# Patient Record
Sex: Female | Born: 1950 | Race: White | Hispanic: No | Smoking: Never smoker
Health system: Southern US, Community
[De-identification: ages and names within clinical notes are randomized; demographics above are authoritative.]

## PROBLEM LIST (undated history)

## (undated) DIAGNOSIS — C4490 Unspecified malignant neoplasm of skin, unspecified: Secondary | ICD-10-CM

## (undated) DIAGNOSIS — R0981 Nasal congestion: Secondary | ICD-10-CM

## (undated) DIAGNOSIS — E119 Type 2 diabetes mellitus without complications: Secondary | ICD-10-CM

## (undated) DIAGNOSIS — I10 Essential (primary) hypertension: Secondary | ICD-10-CM

## (undated) HISTORY — PX: TUBAL LIGATION: SHX77

## (undated) HISTORY — DX: Nasal congestion: R09.81

## (undated) HISTORY — PX: TONSILLECTOMY: SUR1361

## (undated) HISTORY — DX: Unspecified malignant neoplasm of skin, unspecified: C44.90

## (undated) HISTORY — DX: Type 2 diabetes mellitus without complications: E11.9

## (undated) HISTORY — DX: Essential (primary) hypertension: I10

## (undated) HISTORY — PX: EYE SURGERY: SHX253

---

## 1987-02-05 HISTORY — PX: BREAST CYST ASPIRATION: SHX578

## 1997-02-04 HISTORY — PX: BREAST BIOPSY: SHX20

## 2003-05-02 ENCOUNTER — Other Ambulatory Visit: Payer: Self-pay

## 2004-11-12 ENCOUNTER — Ambulatory Visit: Payer: Self-pay | Admitting: General Surgery

## 2005-11-13 ENCOUNTER — Ambulatory Visit: Payer: Self-pay | Admitting: Unknown Physician Specialty

## 2007-01-28 ENCOUNTER — Ambulatory Visit: Payer: Self-pay | Admitting: Unknown Physician Specialty

## 2007-03-04 ENCOUNTER — Ambulatory Visit: Payer: Self-pay | Admitting: Unknown Physician Specialty

## 2008-07-01 ENCOUNTER — Ambulatory Visit: Payer: Self-pay | Admitting: Unknown Physician Specialty

## 2009-08-10 ENCOUNTER — Ambulatory Visit: Payer: Self-pay | Admitting: Unknown Physician Specialty

## 2010-09-26 ENCOUNTER — Ambulatory Visit: Payer: Self-pay | Admitting: Unknown Physician Specialty

## 2011-10-31 ENCOUNTER — Ambulatory Visit: Payer: Self-pay | Admitting: Physician Assistant

## 2012-11-03 ENCOUNTER — Ambulatory Visit: Payer: Self-pay | Admitting: Physician Assistant

## 2012-12-03 ENCOUNTER — Ambulatory Visit (INDEPENDENT_AMBULATORY_CARE_PROVIDER_SITE_OTHER): Payer: BC Managed Care – PPO | Admitting: Podiatrist

## 2012-12-03 ENCOUNTER — Encounter: Payer: Self-pay | Admitting: Podiatrist

## 2012-12-03 VITALS — BP 142/71 | HR 74 | Resp 16 | Ht 59.0 in | Wt 163.0 lb

## 2012-12-03 DIAGNOSIS — M779 Enthesopathy, unspecified: Secondary | ICD-10-CM

## 2012-12-03 DIAGNOSIS — M722 Plantar fascial fibromatosis: Secondary | ICD-10-CM

## 2012-12-03 NOTE — Patient Instructions (Signed)
Plantar Fasciitis (Heel Spur Syndrome)  with Rehab  The plantar fascia is a fibrous, ligament-like, soft-tissue structure that spans the bottom of the foot. Plantar fasciitis is a condition that causes pain in the foot due to inflammation of the tissue.  SYMPTOMS   · Pain and tenderness on the underneath side of the foot.  · Pain that worsens with standing or walking.  CAUSES   Plantar fasciitis is caused by irritation and injury to the plantar fascia on the underneath side of the foot. Common mechanisms of injury include:  · Direct trauma to bottom of the foot.  · Damage to a small nerve that runs under the foot where the main fascia attaches to the heel bone.  · Stress placed on the plantar fascia due to bone spurs.  RISK INCREASES WITH:   · Activities that place stress on the plantar fascia (running, jumping, pivoting, or cutting).  · Poor strength and flexibility.  · Improperly fitted shoes.  · Tight calf muscles.  · Flat feet.  · Failure to warm-up properly before activity.  · Obesity.  PREVENTION  · Warm up and stretch properly before activity.  · Allow for adequate recovery between workouts.  · Maintain physical fitness:  · Strength, flexibility, and endurance.  · Cardiovascular fitness.  · Maintain a health body weight.  · Avoid stress on the plantar fascia.  · Wear properly fitted shoes, including arch supports for individuals who have flat feet.  PROGNOSIS   If treated properly, then the symptoms of plantar fasciitis usually resolve without surgery. However, occasionally surgery is necessary.  RELATED COMPLICATIONS   · Recurrent symptoms that may result in a chronic condition.  · Problems of the lower back that are caused by compensating for the injury, such as limping.  · Pain or weakness of the foot during push-off following surgery.  · Chronic inflammation, scarring, and partial or complete fascia tear, occurring more often from repeated injections.  TREATMENT   Treatment initially involves the use of  ice and medication to help reduce pain and inflammation. The use of strengthening and stretching exercises may help reduce pain with activity, especially stretches of the Achilles tendon. These exercises may be performed at home or with a therapist. Your caregiver may recommend that you use heel cups of arch supports to help reduce stress on the plantar fascia. Occasionally, corticosteroid injections are given to reduce inflammation. If symptoms persist for greater than 6 months despite non-surgical (conservative), then surgery may be recommended.   MEDICATION   · If pain medication is necessary, then nonsteroidal anti-inflammatory medications, such as aspirin and ibuprofen, or other minor pain relievers, such as acetaminophen, are often recommended.  · Do not take pain medication within 7 days before surgery.  · Prescription pain relievers may be given if deemed necessary by your caregiver. Use only as directed and only as much as you need.  · Corticosteroid injections may be given by your caregiver. These injections should be reserved for the most serious cases, because they may only be given a certain number of times.  HEAT AND COLD  · Cold treatment (icing) relieves pain and reduces inflammation. Cold treatment should be applied for 10 to 15 minutes every 2 to 3 hours for inflammation and pain and immediately after any activity that aggravates your symptoms. Use ice packs or massage the area with a piece of ice (ice massage).  · Heat treatment may be used prior to performing the stretching and strengthening activities prescribed   by your caregiver, physical therapist, or athletic trainer. Use a heat pack or soak the injury in warm water.  SEEK IMMEDIATE MEDICAL CARE IF:  · Treatment seems to offer no benefit, or the condition worsens.  · Any medications produce adverse side effects.  EXERCISES  RANGE OF MOTION (ROM) AND STRETCHING EXERCISES - Plantar Fasciitis (Heel Spur Syndrome)  These exercises may help you  when beginning to rehabilitate your injury. Your symptoms may resolve with or without further involvement from your physician, physical therapist or athletic trainer. While completing these exercises, remember:   · Restoring tissue flexibility helps normal motion to return to the joints. This allows healthier, less painful movement and activity.  · An effective stretch should be held for at least 30 seconds.  · A stretch should never be painful. You should only feel a gentle lengthening or release in the stretched tissue.  RANGE OF MOTION - Toe Extension, Flexion  · Sit with your right / left leg crossed over your opposite knee.  · Grasp your toes and gently pull them back toward the top of your foot. You should feel a stretch on the bottom of your toes and/or foot.  · Hold this stretch for __________ seconds.  · Now, gently pull your toes toward the bottom of your foot. You should feel a stretch on the top of your toes and or foot.  · Hold this stretch for __________ seconds.  Repeat __________ times. Complete this stretch __________ times per day.   RANGE OF MOTION - Ankle Dorsiflexion, Active Assisted  · Remove shoes and sit on a chair that is preferably not on a carpeted surface.  · Place right / left foot under knee. Extend your opposite leg for support.  · Keeping your heel down, slide your right / left foot back toward the chair until you feel a stretch at your ankle or calf. If you do not feel a stretch, slide your bottom forward to the edge of the chair, while still keeping your heel down.  · Hold this stretch for __________ seconds.  Repeat __________ times. Complete this stretch __________ times per day.   STRETCH  Gastroc, Standing  · Place hands on wall.  · Extend right / left leg, keeping the front knee somewhat bent.  · Slightly point your toes inward on your back foot.  · Keeping your right / left heel on the floor and your knee straight, shift your weight toward the wall, not allowing your back to  arch.  · You should feel a gentle stretch in the right / left calf. Hold this position for __________ seconds.  Repeat __________ times. Complete this stretch __________ times per day.  STRETCH  Soleus, Standing  · Place hands on wall.  · Extend right / left leg, keeping the other knee somewhat bent.  · Slightly point your toes inward on your back foot.  · Keep your right / left heel on the floor, bend your back knee, and slightly shift your weight over the back leg so that you feel a gentle stretch deep in your back calf.  · Hold this position for __________ seconds.  Repeat __________ times. Complete this stretch __________ times per day.  STRETCH  Gastrocsoleus, Standing   Note: This exercise can place a lot of stress on your foot and ankle. Please complete this exercise only if specifically instructed by your caregiver.   · Place the ball of your right / left foot on a step, keeping   your other foot firmly on the same step.  · Hold on to the wall or a rail for balance.  · Slowly lift your other foot, allowing your body weight to press your heel down over the edge of the step.  · You should feel a stretch in your right / left calf.  · Hold this position for __________ seconds.  · Repeat this exercise with a slight bend in your right / left knee.  Repeat __________ times. Complete this stretch __________ times per day.   STRENGTHENING EXERCISES - Plantar Fasciitis (Heel Spur Syndrome)   These exercises may help you when beginning to rehabilitate your injury. They may resolve your symptoms with or without further involvement from your physician, physical therapist or athletic trainer. While completing these exercises, remember:   · Muscles can gain both the endurance and the strength needed for everyday activities through controlled exercises.  · Complete these exercises as instructed by your physician, physical therapist or athletic trainer. Progress the resistance and repetitions only as guided.  STRENGTH - Towel  Curls  · Sit in a chair positioned on a non-carpeted surface.  · Place your foot on a towel, keeping your heel on the floor.  · Pull the towel toward your heel by only curling your toes. Keep your heel on the floor.  · If instructed by your physician, physical therapist or athletic trainer, add ____________________ at the end of the towel.  Repeat __________ times. Complete this exercise __________ times per day.  STRENGTH - Ankle Inversion  · Secure one end of a rubber exercise band/tubing to a fixed object (table, pole). Loop the other end around your foot just before your toes.  · Place your fists between your knees. This will focus your strengthening at your ankle.  · Slowly, pull your big toe up and in, making sure the band/tubing is positioned to resist the entire motion.  · Hold this position for __________ seconds.  · Have your muscles resist the band/tubing as it slowly pulls your foot back to the starting position.  Repeat __________ times. Complete this exercises __________ times per day.   Document Released: 01/21/2005 Document Revised: 04/15/2011 Document Reviewed: 05/05/2008  [employer]® Patient Information ©2014 [employer].

## 2012-12-03 NOTE — Progress Notes (Signed)
Subjective: She presents today to pick up her orthotics. Relates she continues to have pain along the posterior lateral aspect where the Achilles tendon inserts on left greater than right ankles.  Assessment Achilles tendinitis, plantar fasciitis  Plan patient's orthotics were dispensed. Recommended use and we're instructions. She is going to make a sports to see about getting a new pair of sneakers.  She will be seen back if the orthotics require adjustment or if further treatment should be rendered

## 2013-12-07 ENCOUNTER — Ambulatory Visit: Payer: Self-pay | Admitting: Physician Assistant

## 2015-01-16 ENCOUNTER — Other Ambulatory Visit: Payer: Self-pay | Admitting: Physician Assistant

## 2015-01-16 DIAGNOSIS — Z1231 Encounter for screening mammogram for malignant neoplasm of breast: Secondary | ICD-10-CM

## 2015-02-14 ENCOUNTER — Ambulatory Visit: Payer: Self-pay

## 2015-02-22 ENCOUNTER — Ambulatory Visit: Payer: Self-pay

## 2015-02-28 ENCOUNTER — Ambulatory Visit
Admission: RE | Admit: 2015-02-28 | Discharge: 2015-02-28 | Disposition: A | Discharge status: Home / Self Care | Payer: BC Managed Care – PPO | Source: Ambulatory Visit | Attending: Physician Assistant | Admitting: Physician Assistant

## 2015-02-28 DIAGNOSIS — Z1231 Encounter for screening mammogram for malignant neoplasm of breast: Secondary | ICD-10-CM

## 2016-02-23 ENCOUNTER — Other Ambulatory Visit: Payer: Self-pay | Admitting: Physician Assistant

## 2016-02-23 DIAGNOSIS — Z1231 Encounter for screening mammogram for malignant neoplasm of breast: Secondary | ICD-10-CM

## 2016-03-27 ENCOUNTER — Ambulatory Visit
Admission: RE | Admit: 2016-03-27 | Discharge: 2016-03-27 | Disposition: A | Discharge status: Home / Self Care | Payer: BC Managed Care – PPO | Source: Ambulatory Visit | Attending: Physician Assistant | Admitting: Physician Assistant

## 2016-03-27 DIAGNOSIS — Z1231 Encounter for screening mammogram for malignant neoplasm of breast: Secondary | ICD-10-CM

## 2016-10-25 ENCOUNTER — Other Ambulatory Visit
Admission: RE | Admit: 2016-10-25 | Discharge: 2016-10-25 | Disposition: A | Discharge status: Home / Self Care | Payer: Medicare Other | Source: Ambulatory Visit | Attending: Ophthalmology | Admitting: Ophthalmology

## 2016-10-25 DIAGNOSIS — H019 Unspecified inflammation of eyelid: Secondary | ICD-10-CM | POA: Diagnosis present

## 2016-10-28 LAB — EYE CULTURE: GRAM STAIN: NONE SEEN

## 2017-05-28 ENCOUNTER — Other Ambulatory Visit: Payer: Self-pay | Admitting: Physician Assistant

## 2017-05-28 DIAGNOSIS — Z1231 Encounter for screening mammogram for malignant neoplasm of breast: Secondary | ICD-10-CM

## 2017-06-10 ENCOUNTER — Ambulatory Visit
Admission: RE | Admit: 2017-06-10 | Discharge: 2017-06-10 | Disposition: A | Discharge status: Home / Self Care | Payer: Medicare Other | Source: Ambulatory Visit | Attending: Physician Assistant | Admitting: Physician Assistant

## 2017-06-10 DIAGNOSIS — Z1231 Encounter for screening mammogram for malignant neoplasm of breast: Secondary | ICD-10-CM | POA: Diagnosis not present

## 2017-12-05 ENCOUNTER — Ambulatory Visit: Payer: Medicare Other | Admitting: *Deleted

## 2018-09-14 ENCOUNTER — Other Ambulatory Visit: Payer: Self-pay | Admitting: Physician Assistant

## 2018-09-14 DIAGNOSIS — Z1231 Encounter for screening mammogram for malignant neoplasm of breast: Secondary | ICD-10-CM

## 2018-10-16 ENCOUNTER — Ambulatory Visit
Admission: RE | Admit: 2018-10-16 | Discharge: 2018-10-16 | Disposition: A | Discharge status: Home / Self Care | Payer: Medicare Other | Source: Ambulatory Visit | Attending: Physician Assistant | Admitting: Physician Assistant

## 2018-10-16 DIAGNOSIS — Z1231 Encounter for screening mammogram for malignant neoplasm of breast: Secondary | ICD-10-CM | POA: Insufficient documentation

## 2018-10-24 ENCOUNTER — Encounter: Payer: Self-pay | Admitting: Emergency Medicine

## 2018-10-24 ENCOUNTER — Emergency Department: Payer: Medicare Other

## 2018-10-24 ENCOUNTER — Other Ambulatory Visit: Payer: Self-pay

## 2018-10-24 ENCOUNTER — Emergency Department
Admission: EM | Admit: 2018-10-24 | Discharge: 2018-10-24 | Disposition: A | Discharge status: Home / Self Care | Payer: Medicare Other | Attending: Emergency Medicine | Admitting: Emergency Medicine

## 2018-10-24 DIAGNOSIS — Z85820 Personal history of malignant melanoma of skin: Secondary | ICD-10-CM | POA: Insufficient documentation

## 2018-10-24 DIAGNOSIS — R911 Solitary pulmonary nodule: Secondary | ICD-10-CM | POA: Insufficient documentation

## 2018-10-24 DIAGNOSIS — Z79899 Other long term (current) drug therapy: Secondary | ICD-10-CM | POA: Diagnosis not present

## 2018-10-24 DIAGNOSIS — R1032 Left lower quadrant pain: Secondary | ICD-10-CM | POA: Diagnosis present

## 2018-10-24 DIAGNOSIS — Z881 Allergy status to other antibiotic agents status: Secondary | ICD-10-CM | POA: Insufficient documentation

## 2018-10-24 DIAGNOSIS — I10 Essential (primary) hypertension: Secondary | ICD-10-CM | POA: Insufficient documentation

## 2018-10-24 DIAGNOSIS — Z880 Allergy status to penicillin: Secondary | ICD-10-CM | POA: Insufficient documentation

## 2018-10-24 DIAGNOSIS — Z85828 Personal history of other malignant neoplasm of skin: Secondary | ICD-10-CM | POA: Diagnosis not present

## 2018-10-24 DIAGNOSIS — K529 Noninfective gastroenteritis and colitis, unspecified: Secondary | ICD-10-CM | POA: Diagnosis not present

## 2018-10-24 LAB — COMPREHENSIVE METABOLIC PANEL
ALT: 23 U/L (ref 0–44)
AST: 27 U/L (ref 15–41)
Albumin: 4.7 g/dL (ref 3.5–5.0)
Alkaline Phosphatase: 79 U/L (ref 38–126)
Anion gap: 13 (ref 5–15)
BUN: 13 mg/dL (ref 8–23)
CO2: 25 mmol/L (ref 22–32)
Calcium: 9.5 mg/dL (ref 8.9–10.3)
Chloride: 99 mmol/L (ref 98–111)
Creatinine, Ser: 0.89 mg/dL (ref 0.44–1.00)
GFR calc Af Amer: >60 mL/min (ref >60)
GFR calc non Af Amer: >60 mL/min (ref >60)
Glucose, Bld: 161 mg/dL — ABNORMAL HIGH (ref 70–99)
Potassium: 3.2 mmol/L — ABNORMAL LOW (ref 3.5–5.1)
Sodium: 137 mmol/L (ref 135–145)
Total Bilirubin: 1.6 mg/dL — ABNORMAL HIGH (ref 0.3–1.2)
Total Protein: 7.8 g/dL (ref 6.5–8.1)

## 2018-10-24 LAB — CBC
HCT: 43.1 % (ref 36.0–46.0)
Hemoglobin: 15 g/dL (ref 12.0–15.0)
MCH: 28.8 pg (ref 26.0–34.0)
MCHC: 34.8 g/dL (ref 30.0–36.0)
MCV: 82.9 fL (ref 80.0–100.0)
Platelets: 218 10*3/uL (ref 150–400)
RBC: 5.2 MIL/uL — ABNORMAL HIGH (ref 3.87–5.11)
RDW: 13.5 % (ref 11.5–15.5)
WBC: 9.5 10*3/uL (ref 4.0–10.5)
nRBC: 0 % (ref 0.0–0.2)

## 2018-10-24 LAB — GASTROINTESTINAL PANEL BY PCR, STOOL (REPLACES STOOL CULTURE)
Adenovirus F40/41: NOT DETECTED
Astrovirus: NOT DETECTED
Campylobacter species: NOT DETECTED
Cryptosporidium: NOT DETECTED
Cyclospora cayetanensis: NOT DETECTED
E. coli O157: DETECTED — AB
Entamoeba histolytica: NOT DETECTED
Enteroaggregative E coli (EAEC): NOT DETECTED
Enterotoxigenic E coli (ETEC): NOT DETECTED
Giardia lamblia: NOT DETECTED
Norovirus GI/GII: NOT DETECTED
Plesimonas shigelloides: NOT DETECTED
Rotavirus A: NOT DETECTED
Salmonella species: NOT DETECTED
Sapovirus (I, II, IV, and V): NOT DETECTED
Shiga like toxin producing E coli (STEC): DETECTED — AB
Shigella/Enteroinvasive E coli (EIEC): NOT DETECTED
Vibrio cholerae: NOT DETECTED
Vibrio species: NOT DETECTED
Yersinia enterocolitica: NOT DETECTED

## 2018-10-24 LAB — URINALYSIS, COMPLETE (UACMP) WITH MICROSCOPIC
Bilirubin Urine: NEGATIVE
Glucose, UA: 50 mg/dL — AB
Hgb urine dipstick: NEGATIVE
Ketones, ur: NEGATIVE mg/dL
Nitrite: NEGATIVE
Protein, ur: 100 mg/dL — AB
Specific Gravity, Urine: 1.018 (ref 1.005–1.030)
pH: 5 (ref 5.0–8.0)

## 2018-10-24 LAB — LIPASE, BLOOD: Lipase: 22 U/L (ref 11–51)

## 2018-10-24 LAB — C DIFFICILE QUICK SCREEN W PCR REFLEX
C Diff interpretation: NOT DETECTED
C Diff toxin: NEGATIVE

## 2018-10-24 LAB — C DIFFICILE QUICK SCREEN W PCR REFLEX??: C Diff antigen: NEGATIVE

## 2018-10-24 MED ORDER — IOHEXOL 9 MG/ML PO SOLN
500.0000 mL | Freq: Once | ORAL | Status: AC
  Administered 2018-10-24: 500 mL via ORAL

## 2018-10-24 MED ORDER — METRONIDAZOLE 500 MG PO TABS: 500.0000 mg | ORAL_TABLET | Freq: Three times a day (TID) | ORAL | 0 refills | Status: AC

## 2018-10-24 MED ORDER — METRONIDAZOLE 500 MG PO TABS
500.0000 mg | ORAL_TABLET | Freq: Once | ORAL | Status: AC
  Administered 2018-10-24: 500 mg via ORAL
  Filled 2018-10-24: qty 1

## 2018-10-24 MED ORDER — CIPROFLOXACIN HCL 500 MG PO TABS: 500.0000 mg | ORAL_TABLET | Freq: Two times a day (BID) | ORAL | 0 refills | Status: AC

## 2018-10-24 MED ORDER — IOHEXOL 300 MG/ML  SOLN
100.0000 mL | Freq: Once | INTRAMUSCULAR | Status: AC | PRN
  Administered 2018-10-24: 100 mL via INTRAVENOUS

## 2018-10-24 MED ORDER — CIPROFLOXACIN HCL 500 MG PO TABS
500.0000 mg | ORAL_TABLET | Freq: Once | ORAL | Status: AC
  Administered 2018-10-24: 500 mg via ORAL
  Filled 2018-10-24: qty 1

## 2018-10-24 NOTE — ED Triage Notes (Signed)
Pt to ED from [facility]. Pt states that Thursday night she started having cramping abdominal pain and diarrhea. Pt states that she was having bowel movements about every 30 minutes. Pt states that yesterday afternoon she noticed some pink blood on the tissue. Pt states that she has had about 6 bowel movements today and there has been some pink blood in that as well.   Pt reports similar episode in July after a lot of Peanuts but states that it resolved on her own. Pt is in NAD at this time.

## 2018-10-24 NOTE — ED Notes (Signed)
Reported lab results to Dr Kerman Passey

## 2018-10-24 NOTE — ED Provider Notes (Signed)
[facility] Emergency Department Provider Note  Time seen: 9:44 AM  I have reviewed the triage vital signs and the nursing notes.   HISTORY  Chief Complaint Abdominal Pain and Diarrhea   HPI Amanda Whitney is a 68 y.o. female with a past medical history of hypertension presents to the emergency department for left lower quadrant abdominal pain.  According to the patient for the past 2 days she has been experiencing dull aching pain in the left lower quadrant, somewhat loose stool as well.  States several months ago had similar symptoms that lasted approximately 1 to 2 days and then went away on their own.  No known history of diverticulitis.  Patient does state low-grade fever to 99 on Wednesday but has not had a fever since per patient.  Denies any vomiting.  States occasional loose stool.  Denies any dysuria or hematuria.  No cough or shortness of breath.   Past Medical History:  Diagnosis Date  . HBP (high blood pressure)   . Sinus congestion   . Skin cancer     There are no active problems to display for this patient.   Past Surgical History:  Procedure Laterality Date  . BREAST BIOPSY Right 1999   neg  . BREAST CYST ASPIRATION Left 1989   neg  . EYE SURGERY Left   . TONSILLECTOMY    . TUBAL LIGATION      Prior to Admission medications   Medication Sig Start Date End Date Taking? Authorizing Provider  Calcium Citrate (CITRACAL PO) Take by mouth. TAKE FOUR TABLETS DAILY    [provider]  cetirizine (ZYRTEC) 10 MG tablet Take 10 mg by mouth daily.    [provider]  Coenzyme Q10 (COQ10) 30 MG CAPS Take by mouth daily.    [provider]  cycloSPORINE (RESTASIS) 0.05 % ophthalmic emulsion 1 drop 2 (two) times daily.    [provider]  losartan-hydrochlorothiazide (HYZAAR) 100-12.5 MG per tablet Take 1 tablet by mouth daily.    [provider]  Multiple Vitamins-Minerals (MULTIVITAMIN PO) Take by mouth  daily.    [provider]  rosuvastatin (CRESTOR) 5 MG tablet Take 5 mg by mouth daily.    [provider]    Allergies  Allergen Reactions  . Augmentin [Amoxicillin-Pot Clavulanate]   . Keflex [Cephalexin]   . Lipitor [Atorvastatin] Other (See Comments)    MUSCLE ACHES  . Omnicef [Cefdinir]   . Zocor [Simvastatin] Other (See Comments)    MUSCLE ACHES  . Azithromycin Rash  . Erythromycin Rash    Family History  Problem Relation Age of Onset  . Breast cancer Mother 85  . Breast cancer Cousin     Social History Social History   Tobacco Use  . Smoking status: Never Smoker  . Smokeless tobacco: Never Used  Substance Use Topics  . Alcohol use: No  . Drug use: No    Review of Systems Constitutional: Negative for fever. Cardiovascular: Negative for chest pain. Respiratory: Negative for shortness of breath.  Negative for cough. Gastrointestinal: Moderate left lower quadrant abdominal pain Genitourinary: Negative for urinary compaints Musculoskeletal: Negative for musculoskeletal complaints Skin: Negative for skin complaints  Neurological: Negative for headache All other ROS negative  ____________________________________________   PHYSICAL EXAM:  VITAL SIGNS: ED Triage Vitals  Enc Vitals Group     BP 10/24/18 0927 102/72     Pulse Rate 10/24/18 0927 94     Resp --  Temp 10/24/18 0927 99 F (37.2 C)     Temp Source 10/24/18 0927 Oral     SpO2 10/24/18 0927 100 %     Weight 10/24/18 0933 142 lb (64.4 kg)     Height 10/24/18 0933 4\' 11"  (1.499 m)     Head Circumference --      Peak Flow --      Pain Score 10/24/18 0932 6     Pain Loc --      Pain Edu? --      Excl. in [place]? --    Constitutional: Alert and oriented. Well appearing and in no distress. Eyes: Normal exam ENT      Head: Normocephalic and atraumatic.      Mouth/Throat: Mucous membranes are moist. Cardiovascular: Normal rate, regular rhythm. Respiratory: Normal respiratory  effort without tachypnea nor retractions. Breath sounds are clear Gastrointestinal: Soft, mild left lower quadrant tenderness to palpation.  No rebound guarding or distention.  Very slight diffuse tenderness. Musculoskeletal: Nontender with normal range of motion in all extremities.  Neurologic:  Normal speech and language. No gross focal neurologic deficits Skin:  Skin is warm, dry and intact.  Psychiatric: Mood and affect are normal  ____________________________________________    RADIOLOGY  CT consistent with colitis.  ____________________________________________   INITIAL IMPRESSION / ASSESSMENT AND PLAN / ED COURSE  Pertinent labs & imaging results that were available during my care of the patient were reviewed by me and considered in my medical decision making (see chart for details).   Patient presents emergency department for left lower quadrant abdominal discomfort over the past 2 to 3 days.  Describes a mild to moderate dull aching type pain.  Patient states she has had some loose stool and noted what appeared to be a small amount of blood in her stool earlier today.  Denies any nausea or vomiting.  Overall the patient appears well, no distress.  Mild to moderate left lower quadrant tenderness otherwise benign abdomen.  Differential would include diverticulitis, colitis, UTI, pyelonephritis, ureterolithiasis.  We will check labs and proceed with CT imaging.  Patient agreeable to plan of care.  CT consistent with colitis.  Patient denies any significant abdominal pain, states she will have cramping at times but denies any currently.  We will attempt to obtain a stool sample for further testing.  We will place the patient on ciprofloxacin and Flagyl and have the patient follow-up with her doctor on Monday.  Patient agreeable to plan of care and I discussed return precautions.  Amanda Whitney was evaluated in Emergency Department on 10/24/2018 for the symptoms described in the history of  present illness. She was evaluated in the context of the global COVID-19 pandemic, which necessitated consideration that the patient might be at risk for infection with the SARS-CoV-2 virus that causes COVID-19. Institutional protocols and algorithms that pertain to the evaluation of patients at risk for COVID-19 are in a state of rapid change based on information released by regulatory bodies including the CDC and federal and state organizations. These policies and algorithms were followed during the patient's care in the ED.  ____________________________________________   FINAL CLINICAL IMPRESSION(S) / ED DIAGNOSES  Abdominal pain Colitis   Harvest Dark, MD 10/24/18 1213

## 2018-10-24 NOTE — ED Notes (Signed)
Pt ambulated to the bathroom w/o assistance. Pt unable to give a urine sample at this time.

## 2018-10-24 NOTE — Discharge Instructions (Addendum)
As we discussed your CT scan today is consistent with colitis.  Please take your antibiotics as prescribed for their entire course.  Please follow-up with your doctor on Monday for recheck/reevaluation.  Return to the emergency department for any significant worsening of abdominal pain or development of sustained fever.   Your CT scan also shows a 5 mm lung nodule which is upper limits of normal.  Please discuss this with your doctor to see if further imaging is necessary in 6 months for recheck.

## 2018-10-24 NOTE — ED Provider Notes (Signed)
-----------------------------------------   3:11 PM on 10/24/2018 -----------------------------------------  Patient's GI panel has resulted showing Shigella producing E. coli 0157.  I called the patient and told her to discontinue her antibiotics.  Patient feels like she is able to maintain adequate hydration currently.  I discussed if this is to worsen or she feels like she is becoming dehydrated or her abdominal pain worsen she is to return to the emergency department to consider admission to the hospital for IV hydration.  Patient agreeable to plan of care.   Harvest Dark, MD 10/24/18 1512

## 2019-03-24 LAB — MISCELLANEOUS TEST

## 2019-09-25 ENCOUNTER — Emergency Department (HOSPITAL_COMMUNITY): Payer: Medicare PPO

## 2019-09-25 ENCOUNTER — Emergency Department (HOSPITAL_COMMUNITY)
Admission: EM | Admit: 2019-09-25 | Discharge: 2019-09-25 | Disposition: A | Discharge status: Home / Self Care | Payer: Medicare PPO | Attending: Emergency Medicine | Admitting: Emergency Medicine

## 2019-09-25 ENCOUNTER — Other Ambulatory Visit: Payer: Self-pay

## 2019-09-25 ENCOUNTER — Encounter (HOSPITAL_COMMUNITY): Payer: Self-pay | Admitting: Emergency Medicine

## 2019-09-25 DIAGNOSIS — Y939 Activity, unspecified: Secondary | ICD-10-CM | POA: Diagnosis not present

## 2019-09-25 DIAGNOSIS — I10 Essential (primary) hypertension: Secondary | ICD-10-CM | POA: Insufficient documentation

## 2019-09-25 DIAGNOSIS — S52124A Nondisplaced fracture of head of right radius, initial encounter for closed fracture: Secondary | ICD-10-CM | POA: Insufficient documentation

## 2019-09-25 DIAGNOSIS — Z79899 Other long term (current) drug therapy: Secondary | ICD-10-CM | POA: Diagnosis not present

## 2019-09-25 DIAGNOSIS — Y999 Unspecified external cause status: Secondary | ICD-10-CM | POA: Insufficient documentation

## 2019-09-25 DIAGNOSIS — S0003XA Contusion of scalp, initial encounter: Secondary | ICD-10-CM | POA: Insufficient documentation

## 2019-09-25 DIAGNOSIS — W010XXA Fall on same level from slipping, tripping and stumbling without subsequent striking against object, initial encounter: Secondary | ICD-10-CM | POA: Diagnosis not present

## 2019-09-25 DIAGNOSIS — S00212A Abrasion of left eyelid and periocular area, initial encounter: Secondary | ICD-10-CM | POA: Insufficient documentation

## 2019-09-25 DIAGNOSIS — Y929 Unspecified place or not applicable: Secondary | ICD-10-CM | POA: Insufficient documentation

## 2019-09-25 DIAGNOSIS — S4991XA Unspecified injury of right shoulder and upper arm, initial encounter: Secondary | ICD-10-CM | POA: Diagnosis present

## 2019-09-25 DIAGNOSIS — S52126A Nondisplaced fracture of head of unspecified radius, initial encounter for closed fracture: Secondary | ICD-10-CM

## 2019-09-25 NOTE — ED Provider Notes (Signed)
[facility] EMERGENCY DEPARTMENT Provider Note   CSN: [id number] Arrival date & time: 09/25/19  1008     History Chief Complaint  Patient presents with  . Fall    Amanda Whitney is a 69 y.o. female.  HPI   Patient presents to emergency department from home complaint of fall.  The patient was playing with her grandchildren earlier today with a soccer ball when she incidentally tripped and fell.  She fell landing on her forearms and face.  Denies LOC.  Not on anticoagulation or antiplatelets.  Took Tylenol prior to arrival for pain.  He complained of mild pain on the left side of her head secondary to traumatic incident, and bilateral upper extremity pain.  Ambulatory since the incident occurred.  No nausea or vomiting.  No confusion.  At baseline neurologically.  Patient denies any painful eye movements or vision changes.  Fall mechanical in nature without any preceding dizziness or syncope or other concerning symptoms.     Past Medical History:  Diagnosis Date  . HBP (high blood pressure)   . Sinus congestion   . Skin cancer     There are no problems to display for this patient.   Past Surgical History:  Procedure Laterality Date  . BREAST BIOPSY Right 1999   neg  . BREAST CYST ASPIRATION Left 1989   neg  . EYE SURGERY Left   . TONSILLECTOMY    . TUBAL LIGATION       OB History   No obstetric history on file.     Family History  Problem Relation Age of Onset  . Breast cancer Mother 55  . Breast cancer Cousin     Social History   Tobacco Use  . Smoking status: Never Smoker  . Smokeless tobacco: Never Used  Substance Use Topics  . Alcohol use: No  . Drug use: No    Home Medications Prior to Admission medications   Medication Sig Start Date End Date Taking? Authorizing Provider  Calcium Citrate (CITRACAL PO) Take by mouth. TAKE FOUR TABLETS DAILY    [provider]  cetirizine (ZYRTEC) 10 MG tablet Take 10 mg by mouth daily.     [provider]  Coenzyme Q10 (COQ10) 30 MG CAPS Take by mouth daily.    [provider]  cycloSPORINE (RESTASIS) 0.05 % ophthalmic emulsion 1 drop 2 (two) times daily.    [provider]  losartan-hydrochlorothiazide (HYZAAR) 100-12.5 MG per tablet Take 1 tablet by mouth daily.    [provider]  Multiple Vitamins-Minerals (MULTIVITAMIN PO) Take by mouth daily.    [provider]  rosuvastatin (CRESTOR) 5 MG tablet Take 5 mg by mouth daily.    [provider]    Allergies    Augmentin [amoxicillin-pot clavulanate], Keflex [cephalexin], Lipitor [atorvastatin], Omnicef [cefdinir], Zocor [simvastatin], Azithromycin, and Erythromycin  Review of Systems   Review of Systems  Constitutional: Negative for chills and fever.  HENT: Negative for ear pain and sore throat.   Eyes: Negative for pain and visual disturbance.  Respiratory: Negative for cough and shortness of breath.   Cardiovascular: Negative for chest pain and palpitations.  Gastrointestinal: Negative for abdominal pain and vomiting.  Genitourinary: Negative for dysuria and hematuria.  Musculoskeletal: Positive for arthralgias and myalgias. Negative for back pain.  Skin: Negative for color change and rash.  Neurological: Positive for headaches. Negative for seizures and syncope.  All other systems reviewed and are negative.   Physical Exam Updated Vital  Signs BP (!) 156/65 (BP Location: Right Arm)   Pulse 66   Temp 98.7 F (37.1 C) (Oral)   Resp 16   Ht 4\' 11"  (1.499 m)   Wt 65.8 kg   SpO2 97%   BMI 29.29 kg/m   Physical Exam Vitals and nursing note reviewed.  Constitutional:      General: She is not in acute distress.    Appearance: Normal appearance. She is well-developed and normal weight. She is not ill-appearing or toxic-appearing.  HENT:     Head: Normocephalic and atraumatic.  Eyes:     Extraocular Movements: Extraocular movements intact.      Conjunctiva/sclera: Conjunctivae normal.     Pupils: Pupils are equal, round, and reactive to light.  Cardiovascular:     Rate and Rhythm: Normal rate and regular rhythm.     Pulses: Normal pulses.          Radial pulses are 2+ on the right side and 2+ on the left side.     Heart sounds: No murmur heard.   Pulmonary:     Effort: Pulmonary effort is normal. No respiratory distress.     Breath sounds: Normal breath sounds.  Abdominal:     General: There is no distension.     Palpations: Abdomen is soft.     Tenderness: There is no abdominal tenderness.  Musculoskeletal:     Right elbow: Tenderness present in radial head.     Left elbow: Tenderness present in radial head.     Cervical back: Normal range of motion and neck supple. No rigidity or bony tenderness. Normal range of motion.  Skin:    General: Skin is warm and dry.     Capillary Refill: Capillary refill takes less than 2 seconds.  Neurological:     General: No focal deficit present.     Mental Status: She is alert and oriented to person, place, and time. Mental status is at baseline.  Psychiatric:        Mood and Affect: Mood normal.        Behavior: Behavior normal.     ED Results / Procedures / Treatments   Labs (all labs ordered are listed, but only abnormal results are displayed) Labs Reviewed - No data to display  EKG None  Radiology DG Elbow Complete Left  Result Date: 09/25/2019 CLINICAL DATA:  Fall today, left elbow bruise EXAM: LEFT ELBOW - COMPLETE 3+ VIEW COMPARISON:  None. FINDINGS: No dislocation. Slight cortical irregularity at radial side of the left radial head suggestive of a nondisplaced fracture. No additional fracture. No appreciable joint effusion. No focal osseous lesions. No radiopaque foreign bodies. IMPRESSION: Probable nondisplaced left radial head fracture. No dislocation. Electronically Signed   By: Ilona Sorrel M.D.   On: 09/25/2019 11:49   DG Elbow Complete Right  Result Date:  09/25/2019 CLINICAL DATA:  Fall, right elbow pain EXAM: RIGHT ELBOW - COMPLETE 3+ VIEW COMPARISON:  None. FINDINGS: Nondisplaced right radial head fracture. No additional fracture. No dislocation. No appreciable joint effusion. No focal osseous lesions. No radiopaque foreign bodies. IMPRESSION: Nondisplaced right radial head fracture. Electronically Signed   By: Ilona Sorrel M.D.   On: 09/25/2019 11:48   DG Wrist Complete Right  Result Date: 09/25/2019 CLINICAL DATA:  Fall, right wrist pain EXAM: RIGHT WRIST - COMPLETE 3+ VIEW COMPARISON:  None. 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 No fracture. No dislocation. No focal osseous lesions. Mild first carpometacarpal joint osteoarthritis. No  radiopaque foreign bodies. IMPRESSION: 1. No fracture or dislocation in the right wrist. 2. 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 3. Mild first carpometacarpal joint osteoarthritis. Electronically Signed   By: Ilona Sorrel M.D.   On: 09/25/2019 11:47   CT Head Wo Contrast  Result Date: 09/25/2019 CLINICAL DATA:  Soccer injury at 9:30 a.m. with head injury versus concrete with left forehead hematoma. EXAM: CT HEAD WITHOUT CONTRAST TECHNIQUE: Contiguous axial images were obtained from the base of the skull through the vertex without intravenous contrast. COMPARISON:  None. FINDINGS: Brain: No evidence of parenchymal hemorrhage or extra-axial fluid collection. No mass lesion, mass effect, or midline shift. No CT evidence of acute infarction. Nonspecific minimal subcortical and periventricular white matter hypodensity, most in keeping with chronic small vessel ischemic change. Cerebral volume is age appropriate. No ventriculomegaly. Vascular: No acute abnormality. Skull: Moderate to large anterior left frontal scalp hematoma. No evidence of calvarial fracture. Sinuses/Orbits: The visualized paranasal sinuses are essentially clear. Other:  The mastoid air cells are unopacified.  IMPRESSION: 1. Moderate to large anterior left frontal scalp hematoma. No evidence of calvarial fracture. 2. No evidence of acute intracranial abnormality. 3. Minimal chronic small vessel ischemic changes in the cerebral white matter. Electronically Signed   By: Ilona Sorrel M.D.   On: 09/25/2019 12:30    Procedures Procedures (including critical care time)  Medications Ordered in ED Medications - No data to display  ED Course   Ranee Peasley is a 69 y.o. female with PMHx listed that presents to the Emergency Department complaint of Fall   ED Course: Initial exam completed.   Well-appearing and hemodynamically stable.  Nontoxic and afebrile.  Physical exam significant for 69 year old female with abrasions to the left periorbital region, extraocular movements intact, pupils equal and reactive, bony tenderness of both bilateral radial heads, and otherwise neurovascularly intact.  Initial differential includes fracture, dislocation, melena, and traumatic ICH.  Triage work-up reviewed.   CT head with a moderate/large anterior left frontal scalp hematoma without evidence of fracture and otherwise no acute findings.  XR left elbow with probable nondisplaced left radial head fracture.  XR right elbow nondisplaced right radial head fracture.  XR Right wrist without fracture or malalignment.  Due to the bilateral nature, I did speak with Dr. Grandville Silos with hand surgery, he recommends sling application with use on the most affected arm and patient can alternate as needed, follow-up with his office and they will arrange this follow-up.  No indication for splinting at this time.   Diagnostics Vital Signs: reviewed Labs: reviewed and significant findings discussed above Imaging: personally reviewed images interpreted by radiology EKG: reviewed Records: nursing notes along with previous records reviewed and pertinent data discussed   Consults:  Hand   Reevaluation/Disposition:  Upon reevaluation,  patients symptoms stable. No active nausea/vomiting and ambulatory without assistance prior to discharge from the emergency department.    All questions answered.  Strict return precautions were discussed. Additionally we discussed establishing and/or following-up with primary care physician.  Patient and/or family was understanding and in agreement with today's assessment and plan.   Sherolyn Buba, MD Emergency Medicine, PGY-3   Note: Dragon medical dictation software was used in the creation of this note.   Final Clinical Impression(s) / ED Diagnoses Final diagnoses:  Closed nondisplaced fracture of head of radius, unspecified laterality, initial encounter    Rx / DC Orders ED Discharge Orders    None       Frann Rider, MD  09/26/19 0567    Noemi Chapel, MD 09/26/19 [phone]

## 2019-09-25 NOTE — Progress Notes (Signed)
Orthopedic Tech Progress Note Patient Details:  Amanda Whitney [birth year] [id number]  Ortho Devices Type of Ortho Device: Shoulder immobilizer Ortho Device/Splint Location: Bilateral Ortho Device/Splint Interventions: Application, Ordered   Post Interventions Patient Tolerated: Well Instructions Provided: Care of device, Adjustment of device   [name] A [name] 09/25/2019, 3:47 PM

## 2019-09-25 NOTE — ED Provider Notes (Signed)
I saw and evaluated the patient, reviewed the resident's note and I agree with the findings and plan.  Pertinent History: Well-appearing 69 year old female presents after having an accidental fall while playing soccer with a grandchild.  She went down to the ground striking the left side of her forehead and face as well as her bilateral elbows.  Now having bilateral elbow pain and forearm pain.  Pertinent Exam findings: On exam the patient does have some ecchymosis around the left eye, no active bleeding, no malocclusion, no dental injury, full range of motion of the neck without any tenderness of the posterior cervical spine.  She has normal range of motion of the bilateral upper extremities at all major joints however with pronation and supination and some flexion and extension there is some tenderness to the bilateral elbows and proximal forearms.  Neurovascularly the patient is normal with good pulses good sensation  I was personally present and directly supervised the following procedures:  Medical and trauma evaluation  We will discuss with hand surgery for formal recommendations though I suspect sugar tong immobilization and follow-up.  I personally interpreted the EKG as well as the resident and agree with the interpretation on the resident's chart.  Final diagnoses:  Closed nondisplaced fracture of head of radius, unspecified laterality, initial encounter      Noemi Chapel, MD 09/26/19 [phone]

## 2019-09-25 NOTE — Discharge Instructions (Addendum)
Wear slings as tolerated.  Follow-up with Dr. Grandville Silos with Hand Surgery.  Tylenol as needed for pain.  Return to ED for any worsening or concerning symptoms.

## 2019-09-25 NOTE — ED Triage Notes (Addendum)
Pt fell playing soccer around 9:30am.  No LOC.  Denies neck and back pain.  Hit head on concrete.  Hematoma to L forehead.Reports bilateral elbow pain and R wrist pain. No blood thinner.

## 2019-10-10 ENCOUNTER — Emergency Department: Payer: Medicare PPO

## 2019-10-10 ENCOUNTER — Other Ambulatory Visit: Payer: Self-pay

## 2019-10-10 ENCOUNTER — Emergency Department
Admission: EM | Admit: 2019-10-10 | Discharge: 2019-10-10 | Disposition: A | Discharge status: Home / Self Care | Payer: Medicare PPO | Attending: Student in an Organized Health Care Education/Training Program | Admitting: Student in an Organized Health Care Education/Training Program

## 2019-10-10 DIAGNOSIS — R42 Dizziness and giddiness: Secondary | ICD-10-CM | POA: Diagnosis not present

## 2019-10-10 LAB — CBC
HCT: 40.9 % (ref 36.0–46.0)
Hemoglobin: 14.5 g/dL (ref 12.0–15.0)
MCH: 29.7 pg (ref 26.0–34.0)
MCHC: 35.5 g/dL (ref 30.0–36.0)
MCV: 83.8 fL (ref 80.0–100.0)
Platelets: 240 10*3/uL (ref 150–400)
RBC: 4.88 MIL/uL (ref 3.87–5.11)
RDW: 13.5 % (ref 11.5–15.5)
WBC: 8.3 10*3/uL (ref 4.0–10.5)
nRBC: 0 % (ref 0.0–0.2)

## 2019-10-10 LAB — BASIC METABOLIC PANEL
Anion gap: 12 (ref 5–15)
BUN: 10 mg/dL (ref 8–23)
CO2: 24 mmol/L (ref 22–32)
Calcium: 10.2 mg/dL (ref 8.9–10.3)
Chloride: 102 mmol/L (ref 98–111)
Creatinine, Ser: 0.51 mg/dL (ref 0.44–1.00)
GFR calc Af Amer: >60 mL/min (ref >60)
GFR calc non Af Amer: >60 mL/min (ref >60)
Glucose, Bld: 128 mg/dL — ABNORMAL HIGH (ref 70–99)
Potassium: 4.2 mmol/L (ref 3.5–5.1)
Sodium: 138 mmol/L (ref 135–145)

## 2019-10-10 MED ORDER — MECLIZINE HCL 25 MG PO TABS
25.0000 mg | ORAL_TABLET | Freq: Once | ORAL | Status: AC
  Administered 2019-10-10: 25 mg via ORAL
  Filled 2019-10-10: qty 1

## 2019-10-10 MED ORDER — MECLIZINE HCL 25 MG PO TABS: 25.0000 mg | ORAL_TABLET | Freq: Three times a day (TID) | ORAL | 0 refills | Status: DC | PRN

## 2019-10-10 NOTE — ED Notes (Signed)
Pt to bathroom on arrival to room.

## 2019-10-10 NOTE — ED Triage Notes (Signed)
Pt c/o multiple dizzy spells today with nausea. States she had a fall 09/25/19 and was seen at [facility] and had a negative CT.

## 2019-10-10 NOTE — ED Provider Notes (Signed)
[facility] Emergency Department Provider Note    First MD Initiated Contact with Patient 10/10/19 2043     (approximate)  I have reviewed the triage vital signs and the nursing notes.   HISTORY  Chief Complaint Dizziness    HPI Amanda Whitney is a 69 y.o. female below listed past medical history presents to the ER for evaluation of multiple episodes of dizziness particular when she lays back.  Feels like the room is spinning.  The symptoms have been ongoing for about 24 hours.  She did have a minor head injury roughly 2 weeks ago had negative CT imaging.  Was having persistent headaches since then.  Denies any numbness or tingling.  Denies any history of vertigo.  No history of stroke.    Past Medical History:  Diagnosis Date  . HBP (high blood pressure)   . Sinus congestion   . Skin cancer    Family History  Problem Relation Age of Onset  . Breast cancer Mother 76  . Breast cancer Cousin    Past Surgical History:  Procedure Laterality Date  . BREAST BIOPSY Right 1999   neg  . BREAST CYST ASPIRATION Left 1989   neg  . EYE SURGERY Left   . TONSILLECTOMY    . TUBAL LIGATION     There are no problems to display for this patient.     Prior to Admission medications   Medication Sig Start Date End Date Taking? Authorizing Provider  Calcium Citrate (CITRACAL PO) Take by mouth. TAKE FOUR TABLETS DAILY    [provider]  cetirizine (ZYRTEC) 10 MG tablet Take 10 mg by mouth daily.    [provider]  Coenzyme Q10 (COQ10) 30 MG CAPS Take by mouth daily.    [provider]  cycloSPORINE (RESTASIS) 0.05 % ophthalmic emulsion 1 drop 2 (two) times daily.    [provider]  losartan-hydrochlorothiazide (HYZAAR) 100-12.5 MG per tablet Take 1 tablet by mouth daily.    [provider]  meclizine (ANTIVERT) 25 MG tablet Take 1 tablet (25 mg total) by mouth 3 (three) times daily as needed for dizziness. 10/10/19    Merlyn Lot, MD  Multiple Vitamins-Minerals (MULTIVITAMIN PO) Take by mouth daily.    [provider]  rosuvastatin (CRESTOR) 5 MG tablet Take 5 mg by mouth daily.    [provider]    Allergies Augmentin [amoxicillin-pot clavulanate], Keflex [cephalexin], Lipitor [atorvastatin], Omnicef [cefdinir], Zocor [simvastatin], Azithromycin, and Erythromycin    Social History Social History   Tobacco Use  . Smoking status: Never Smoker  . Smokeless tobacco: Never Used  Substance Use Topics  . Alcohol use: No  . Drug use: No    Review of Systems Patient denies headaches, rhinorrhea, blurry vision, numbness, shortness of breath, chest pain, edema, cough, abdominal pain, nausea, vomiting, diarrhea, dysuria, fevers, rashes or hallucinations unless otherwise stated above in HPI. ____________________________________________   PHYSICAL EXAM:  VITAL SIGNS: Vitals:   10/10/19 1552 10/10/19 2130  BP: (!) 182/81 (!) 167/73  Pulse: 80 78  Resp: 18 15  Temp: 98.6 F (37 C)   SpO2: 100% 97%    Constitutional: Alert and oriented.  Eyes: Conjunctivae are normal.  Head: Atraumatic. Nose: No congestion/rhinnorhea. Mouth/Throat: Mucous membranes are moist.   Neck: No stridor. Painless ROM.  Cardiovascular: Normal rate, regular rhythm. Grossly normal heart sounds.  Good peripheral circulation. Respiratory: Normal respiratory effort.  No retractions. Lungs CTAB. Gastrointestinal: Soft and nontender. No distention. No  abdominal bruits. No CVA tenderness. Genitourinary:  Musculoskeletal: No lower extremity tenderness nor edema.  No joint effusions. Neurologic: CN- intact.  No facial droop, Normal FNF.  Normal heel to shin.  Sensation intact bilaterally. Normal speech and language. No gross focal neurologic deficits are appreciated. No gait instability. Skin:  Skin is warm, dry and intact. No rash noted. Psychiatric: Mood and affect are normal. Speech and behavior are  normal.  ____________________________________________   LABS (all labs ordered are listed, but only abnormal results are displayed)  Results for orders placed or performed during the hospital encounter of 10/10/19 (from the past 24 hour(s))  Basic metabolic panel     Status: Abnormal   Collection Time: 10/10/19  4:03 PM  Result Value Ref Range   Sodium 138 135 - 145 mmol/L   Potassium 4.2 3.5 - 5.1 mmol/L   Chloride 102 98 - 111 mmol/L   CO2 24 22 - 32 mmol/L   Glucose, Bld 128 (H) 70 - 99 mg/dL   BUN 10 8 - 23 mg/dL   Creatinine, Ser 0.51 0.44 - 1.00 mg/dL   Calcium 10.2 8.9 - 10.3 mg/dL   GFR calc non Af Amer >60 >60 mL/min   GFR calc Af Amer >60 >60 mL/min   Anion gap 12 5 - 15  CBC     Status: None   Collection Time: 10/10/19  4:03 PM  Result Value Ref Range   WBC 8.3 4.0 - 10.5 K/uL   RBC 4.88 3.87 - 5.11 MIL/uL   Hemoglobin 14.5 12.0 - 15.0 g/dL   HCT 40.9 36 - 46 %   MCV 83.8 80.0 - 100.0 fL   MCH 29.7 26.0 - 34.0 pg   MCHC 35.5 30.0 - 36.0 g/dL   RDW 13.5 11.5 - 15.5 %   Platelets 240 150 - 400 K/uL   nRBC 0.0 0.0 - 0.2 %   ____________________________________________  EKG My review and personal interpretation at Time: 15:55   Indication: dizziness  Rate: 75  Rhythm: sinus Axis: normal Other: normal intervals, no stemi ____________________________________________  RADIOLOGY  I personally reviewed all radiographic images ordered to evaluate for the above acute complaints and reviewed radiology reports and findings.  These findings were personally discussed with the patient.  Please see medical record for radiology report.  ____________________________________________   PROCEDURES  Procedure(s) performed:  Procedures    Critical Care performed: no ____________________________________________   INITIAL IMPRESSION / ASSESSMENT AND PLAN / ED COURSE  Pertinent labs & imaging results that were available during my care of the patient were reviewed by me  and considered in my medical decision making (see chart for details).   DDX: BPPV, dehydration, electrolyte abnormality, CVA, postconcussive syndrome  Deeanna Beightol is a 69 y.o. who presents to the ED with presentation as described above.  Her history and physical exam are consistent with peripheral vertigo with a benign hints exam.  CT imaging without any evidence of ICH.  Discussed recommendation for further diagnostic imaging including MRI.  Patient states she preferred to have this performed as an outpatient.  Will give referral to neurology.  She does not have any focal deficits to suggest CVA and as her exam is otherwise consistent with peripheral vertigo I think that outpatient follow-up and a trial of meclizine would be appropriate.  Have discussed with the patient and available family all diagnostics and treatments performed thus far and all questions were answered to the best of my ability. The patient demonstrates understanding and  agreement with plan.      The patient was evaluated in Emergency Department today for the symptoms described in the history of present illness. He/she was evaluated in the context of the global COVID-19 pandemic, which necessitated consideration that the patient might be at risk for infection with the SARS-CoV-2 virus that causes COVID-19. Institutional protocols and algorithms that pertain to the evaluation of patients at risk for COVID-19 are in a state of rapid change based on information released by regulatory bodies including the CDC and federal and state organizations. These policies and algorithms were followed during the patient's care in the ED.  As part of my medical decision making, I reviewed the following data within the [place] notes reviewed and incorporated, Labs reviewed, notes from prior ED visits and [place] Controlled Substance Database   ____________________________________________   FINAL CLINICAL IMPRESSION(S) / ED  DIAGNOSES  Final diagnoses:  Dizziness      NEW MEDICATIONS STARTED DURING THIS VISIT:  New Prescriptions   MECLIZINE (ANTIVERT) 25 MG TABLET    Take 1 tablet (25 mg total) by mouth 3 (three) times daily as needed for dizziness.     Note:  This document was prepared using Dragon voice recognition software and may include unintentional dictation errors.    Merlyn Lot, MD 10/10/19 2224

## 2019-11-03 ENCOUNTER — Ambulatory Visit: Payer: Medicare PPO | Attending: Neurology

## 2019-11-03 ENCOUNTER — Other Ambulatory Visit: Payer: Self-pay

## 2019-11-03 DIAGNOSIS — R42 Dizziness and giddiness: Secondary | ICD-10-CM | POA: Diagnosis present

## 2019-11-03 NOTE — Therapy (Signed)
[place] MAIN [facility] SERVICES [address] [place], [place], [address] Phone: [phone]   Fax:  [phone]  Physical Therapy Evaluation  Patient Details  Name: Amanda Whitney MRN: [id number] Date of Birth: [birth year] Referring Provider (PT): Dr. Manuella Whitney   Encounter Date: 11/03/2019   PT End of Session - 11/03/19 1536    Visit Number 1    Number of Visits 9    Date for PT Re-Evaluation 12/29/19    Authorization Type eval: 11/03/19    PT Start Time 1520    PT Stop Time 1615    PT Time Calculation (min) 55 min    Activity Tolerance Patient tolerated treatment well    Behavior During Therapy [facility] for tasks assessed/performed           Past Medical History:  Diagnosis Date  . HBP (high blood pressure)   . Sinus congestion   . Skin cancer     Past Surgical History:  Procedure Laterality Date  . BREAST BIOPSY Right 1999   neg  . BREAST CYST ASPIRATION Left 1989   neg  . EYE SURGERY Left   . TONSILLECTOMY    . TUBAL LIGATION      There were no vitals filed for this visit.    Subjective Assessment - 11/03/19 1501    Subjective Dizziness    Pertinent History Amanda Whitney is a 69 y.o. who presented to the ED on 09/25/19 after having an accidental fall while playing soccer with her grandchild.  She went down to the ground striking the left side of her forehead and face as well as her bilateral elbows. Denied LOC. Not on anticoagulation or antiplatelets. She complained of mild pain on the left side of her head secondary to traumatic incident, and bilateral upper extremity pain. No nausea or vomiting.  No confusion.  Fall was mechanical in nature without any preceding dizziness or syncope or other concerning symptoms. She was found to have bilateral radial head fractures and scalp hematoma treated with arm slings. She returleft elbow with probable nondisplaced left radial head fracture.  XR right elbow nondisplaced right radial head fracture.   XR Right wrist without fracture or malalignment. She presented to [facility] ED approximately 2 weeks later on 10/10/19 with dizziness. It was determined that her history and physical exam were consistent with peripheral vertigo with a benign hints exam.  CT imaging without any evidence of ICH.  Pt deferred MRI and was referred to neurology as an outpatient. Pt saw Dr. Manuella Whitney at [facility] neurology and she was referred to [facility] vestibular therapy for BPPV. She is complaining of swimmy headed spells mostly occuring when lying down initially, turning head from side to side during sleep. Symptoms are brief and only last for seconds. They improve if she closes her eyes. She continues to deny numbness, tingling, and weakness in her legs. Pt reports that neurology did not feel like she had a concussion. She is reporting some increased fatigue since her fall but denies any nausea, vomiting, confusion, or emotional lability. She does report some L frontal headaches near where she struck her head which didn't start until her dizziness began two weeks after the fall.    Currently in Pain? No/denies           VESTIBULAR AND BALANCE EVALUATION   HISTORY:  Subjective history of current problem: Amanda Whitney is a 69 y.o. who presented to the ED on 09/25/19 after having an accidental fall while playing soccer  with her grandchild.  She went down to the ground striking the left side of her forehead and face as well as her bilateral elbows. Denied LOC. Not on anticoagulation or antiplatelets. She complained of mild pain on the left side of her head secondary to traumatic incident, and bilateral upper extremity pain. No nausea or vomiting.  No confusion.  Fall was mechanical in nature without any preceding dizziness or syncope or other concerning symptoms. She was found to have bilateral radial head fractures and scalp hematoma treated with arm slings. She returleft elbow with probable nondisplaced left radial head fracture.  XR right elbow  nondisplaced right radial head fracture.  XR Right wrist without fracture or malalignment. She presented to [facility] ED approximately 2 weeks later on 10/10/19 with dizziness. It was determined that her history and physical exam were consistent with peripheral vertigo with a benign hints exam.  CT imaging without any evidence of ICH.  Pt deferred MRI and was referred to neurology as an outpatient. Pt saw Dr. Manuella Whitney at [facility] neurology and she was referred to [facility] vestibular therapy for BPPV. She is complaining of swimmy headed spells mostly occuring when lying down initially, turning head from side to side during sleep. Symptoms are brief and only last for seconds. They improve if she closes her eyes. She continues to deny numbness, tingling, and weakness in her legs. Pt reports that neurology did not feel like she had a concussion. She is reporting some increased fatigue since her fall but denies any nausea, vomiting, confusion, or emotional lability. She does report some L frontal headaches near where she struck her head which didn't start until her dizziness began two weeks after the fall.   Description of dizziness: (vertigo, unsteadiness, lightheadedness, falling, general unsteadiness, whoozy, swimmy-headed sensation, aural fullness) whooziness as well as vertigo with laying back and rolling over in bed.   Frequency: Multiple times/day (2-3x/day) Duration: seconds Symptom nature: (motion provoked, positional, spontaneous, constant, variable, intermittent) motion-provoked  Provocative Factors: Looking up, looking down, rolling over Easing Factors: closing eyes, meclizine (only taken twice);  Progression of symptoms: (better, worse, no change since onset) better History of similar episodes: None  Falls (yes/no): Yes Number of falls in past 6 months: 1   Prior Functional Level: Fully independent with ambulation without assistive device.   Auditory complaints (tinnitus, pain, drainage, hearing loss, aural  fullness): None Vision (diplopia, visual field loss, recent changes, last eye exam): None, wears glasses for driving. Saw eye doctor last week without any issues History of headaches/migraines: Intermittent headaches which started 2 weeks after the head trauma. No history of migraines.   Red Flags: (dysarthria, dysphagia, drop attacks, bowel and bladder changes, recent weight loss/gain) Review of systems negative for red flags with the exception of skin CA (basal cell)    EXAMINATION  POSTURE:   NEUROLOGICAL SCREEN: (2+ unless otherwise noted.) N=normal  Ab=abnormal  Level Dermatome R L Myotome R L Reflex R L  C3 Anterior Neck N N Sidebend C2-3 N N Jaw CN V    C4 Top of Shoulder N N Shoulder Shrug C4 N N Hoffman's UMN    C5 Lateral Upper Arm N N Shoulder ABD C4-5 N N Biceps C5-6    C6 Lateral Arm/ Thumb N N Arm Flex/ Wrist Ext C5-6 N N Brachiorad. C5-6    C7 Middle Finger N N Arm Ext//Wrist Flex C6-7 N N Triceps C7    C8 4th & 5th Finger N N Flex/ Ext Carpi Ulnaris C8  N N Patellar (L3-4)    T1 Medial Arm N N Interossei T1 N N Gastrocnemius    L2 Medial thigh/groin N N Illiopsoas (L2-3) N N     L3 Lower thigh/med.knee N N Quadriceps (L3-4) N N     L4 Medial leg/lat thigh N N Tibialis Ant (L4-5) N N     L5 Lat. leg & dorsal foot N N EHL (L5) N N     S1 post/lat foot/thigh/leg N N Gastrocnemius (S1-2) N N     S2 Post./med. thigh & leg N N Hamstrings (L4-S3) N N       Cranial Nerves Visual acuity and visual fields are intact  Extraocular muscles are intact  Facial sensation is intact bilaterally  Facial strength is intact bilaterally  Hearing is normal as tested by gross conversation Palate elevates midline, normal phonation  Shoulder shrug strength is intact  Tongue protrudes midline    SOMATOSENSORY:         Sensation           Intact      Diminished         Absent  Light touch Normal      COORDINATION: Finger to Nose: Normal Heel to Shin: Normal Pronator Drift:  Negative Rapid Alternating Movements: Normal Finger to Thumb Opposition: Normal  MUSCULOSKELETAL SCREEN: Cervical Spine ROM: WFL and painless in all planes. No gross deficits identified   ROM: WNL  MMT: WNL  Functional Mobility: WNL  Gait: WNL, no assistive device    POSTURAL CONTROL TESTS:   Clinical Test of Sensory Interaction for Balance    (CTSIB): Deferred    OCULOMOTOR / VESTIBULAR TESTING:  Oculomotor Exam- Room Light  Findings Comments  Ocular Alignment normal   Ocular ROM normal   Spontaneous Nystagmus normal   Gaze-Holding Nystagmus normal   End-Gaze Nystagmus normal   Vergence (normal 2-3") normal   Smooth Pursuit abnormal Saccadic  Cross-Cover Test normal   Saccades normal   VOR Cancellation normal   Left Head Impulse abnormal Corrective saccade  Right Head Impulse abnormal Corrective saccade  Static Acuity not examined   Dynamic Acuity not examined     Oculomotor Exam- Fixation Suppressed: Deferred BPPV TESTS:  Symptoms Duration Intensity Nystagmus  L Dix-Hallpike Vertigo Approximately 15s 8/10 Upbeating L torsional nystagmus  R Dix-Hallpike None   None  L Head Roll None   None  R Head Roll None   None  L Sidelying Test      R Sidelying Test        FUNCTIONAL OUTCOME MEASURES   Results Comments  FOTO 71 Predicted improvement to 83  ABC Scale 71.875% Above Cut-off  DHI 22/100 Mild perception of dizziness      Pt is a pleasant 69 year-old female referred by Dr. Manuella Whitney for BPPV. PT examination reveals no focal neurological deficits. She has a positive L Dix-Hallpike test for upbeating L torsional nystagmus with concurrent vertigo of 8/10 severity. 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 Pt treated with Epley maneuver for L side and with retesting has almost full resolution of both symptoms and vertigo. Pt educated about how to perform Epley maneuver at home for L side and she will return to physical therapy for additional testing and  treatment. She will benefit from skilled PT services to address deficits vertigo and improve her symptom-free function at home and with leisure activities such as playing with her grandchildren.          [place]  PT Assessment - 11/03/19 1545      Assessment   Medical Diagnosis BPPV    Referring Provider (PT) Dr. Manuella Whitney    Onset Date/Surgical Date 10/10/19    Hand Dominance Left    Next MD Visit Not reported    Prior Therapy None      Precautions   Precautions Fall      Restrictions   Weight Bearing Restrictions No      Balance Screen   Has the patient fallen in the past 6 months Yes    How many times? 1    Has the patient had a decrease in activity level because of a fear of falling?  Yes    Is the patient reluctant to leave their home because of a fear of falling?  No      Home Environment   Living Environment Private residence    Living Arrangements Alone    Available Help at Discharge Family    Type of [place] One level      Prior Function   Level of Independence Independent      Cognition   Overall Cognitive Status Within Functional Limits for tasks assessed                      Objective measurements completed on examination: See above findings.      Canalith Repositioning Treatment Pt treated with 2 bouts of Epley Maneuver for presumed L posterior canal BPPV. 1.5 minute holds in each position and retesting between maneuvers. After first maneuver pt only has a few barely perceptible L torsional upbeats of nystagmus and she reports 2/10 dizziness with retesting. Pt taken through second bout of Epley manevuer.          PT Education - 11/03/19 1547    Education Details Plan of care, BPPV    Person(s) Educated Patient    Methods Explanation;Handout    Comprehension Verbalized understanding            PT Short Term Goals - 11/03/19 1538      PT SHORT TERM GOAL #1   Title Pt will be independent  with HEP in order to decrease dizziness in order to improve function at home.    Time 4    Period Weeks    Status New    Target Date 01/26/20             PT Long Term Goals - 11/03/19 1544      PT LONG TERM GOAL #1   Title Pt will report no further episodes of vertigo when rolling in bed, bending over, or looking up toward the ceiling    Time 8    Period Weeks    Status New    Target Date 12/29/19      PT LONG TERM GOAL #2   Title Pt will decrease DHI score by at least 18 points in order to demonstrate clinically significant reduction in disability    Baseline 11/03/19:    Time 8    Period Weeks    Status New    Target Date 12/29/19      PT LONG TERM GOAL #3   Title Pt will improve FOTO score to at least 83 in order to demonstrate significant improvement in her function.    Baseline 11/03/19: 71    Time 8    Period Weeks    Status New  Target Date 12/29/19                  Plan - 11/03/19 1537    Clinical Impression Statement Pt is a pleasant 69 year-old female referred by Dr. Manuella Whitney for BPPV. PT examination reveals no focal neurological deficits. She has a positive L Dix-Hallpike test for upbeating L torsional nystagmus with concurrent vertigo of 8/10 severity. 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 Pt treated with Epley maneuver for L side and with retesting has almost full resolution of both symptoms and vertigo. Pt educated about how to perform Epley maneuver at home for L side and she will return to physical therapy for additional testing and treatment. She will benefit from skilled PT services to address deficits vertigo and improve her symptom-free function at home and with leisure activities such as playing with her grandchildren.    Personal Factors and Comorbidities Comorbidity 1    Comorbidities HTN    Examination-Activity Limitations [place];Stairs    Examination-Participation Restrictions Community Activity;Interpersonal Relationship     Stability/Clinical Decision Making Unstable/Unpredictable    Clinical Decision Making Low    Rehab Potential Excellent    PT Frequency 1x / week    PT Duration 8 weeks    PT Treatment/Interventions ADLs/Self Care Home Management;Aquatic Therapy;Biofeedback;Canalith Repostioning;Cryotherapy;Electrical Stimulation;Moist Heat;Traction;DME Instruction;Gait training;Stair training;Functional mobility training;Therapeutic activities;Therapeutic exercise;Balance training;Neuromuscular re-education;Patient/family education;Manual techniques;Passive range of motion;Dry needling;Vestibular    PT Next Visit Plan Retest BPPV and treat as needed, once clear test balance if appropriate/needed    PT Home Exercise Plan Access Code: [email or web address]    Consulted and Agree with Plan of Care Patient           Patient will benefit from skilled therapeutic intervention in order to improve the following deficits and impairments:  Dizziness  Visit Diagnosis: Dizziness and giddiness     Problem List There are no problems to display for this patient.  Phillips Grout PT, DPT, GCS  Amor Hyle 11/03/2019, 5:35 PM  [place] MAIN [facility] SERVICES [address] [place], [place], [address] Phone: [phone]   Fax:  [phone]  Name: Amanda Whitney MRN: [id number] Date of Birth: [birth year]

## 2019-11-03 NOTE — Patient Instructions (Addendum)
Access Code: [email or web address] URL: [email or web address] Date: 11/03/2019 Prepared by: Roxana Hires  Exercises Self-Epley Maneuver Left Ear - 1 x daily - 7 x weekly

## 2019-11-10 ENCOUNTER — Other Ambulatory Visit: Payer: Self-pay | Admitting: Physician Assistant

## 2019-11-10 ENCOUNTER — Ambulatory Visit: Payer: Medicare PPO

## 2019-11-10 DIAGNOSIS — Z1231 Encounter for screening mammogram for malignant neoplasm of breast: Secondary | ICD-10-CM

## 2019-11-15 ENCOUNTER — Other Ambulatory Visit: Payer: Self-pay

## 2019-11-15 ENCOUNTER — Ambulatory Visit: Payer: Medicare PPO | Attending: Neurology

## 2019-11-15 DIAGNOSIS — R42 Dizziness and giddiness: Secondary | ICD-10-CM

## 2019-11-15 NOTE — Therapy (Signed)
[place] MAIN [facility] SERVICES [address] [place], [place], [address] Phone: [phone]   Fax:  [phone]  Physical Therapy Treatment/Discharge  Patient Details  Name: Amanda Whitney MRN: [id number] Date of Birth: Feb 28, 1950 Referring Provider (PT): Dr. Manuella Ghazi   Encounter Date: 11/15/2019   PT End of Session - 11/15/19 1608    Visit Number 2    Number of Visits 9    Date for PT Re-Evaluation 12/29/19    Authorization Type eval: 11/03/19    PT Start Time 1604    PT Stop Time 1635    PT Time Calculation (min) 31 min    Activity Tolerance Patient tolerated treatment well    Behavior During Therapy [facility] for tasks assessed/performed           Past Medical History:  Diagnosis Date  . HBP (high blood pressure)   . Sinus congestion   . Skin cancer     Past Surgical History:  Procedure Laterality Date  . BREAST BIOPSY Right 1999   neg  . BREAST CYST ASPIRATION Left 1989   neg  . EYE SURGERY Left   . TONSILLECTOMY    . TUBAL LIGATION      There were no vitals filed for this visit.   Subjective Assessment - 11/15/19 1607    Subjective Pt reports that she is doing well. She felt "sea sick" after her initial evaluation and into the next day but symptoms resolved. She was able to perform the home Epley maneuver without any reproduction of vertigo. No further episodes of vertigo since her initial evaluation and pt reports 100% improvement in her symptoms.    Pertinent History Kianna Billet is a 69 y.o. who presented to the ED on 09/25/19 after having an accidental fall while playing soccer with her grandchild.  She went down to the ground striking the left side of her forehead and face as well as her bilateral elbows. Denied LOC. Not on anticoagulation or antiplatelets. She complained of mild pain on the left side of her head secondary to traumatic incident, and bilateral upper extremity pain. No nausea or vomiting.  No confusion.  Fall was  mechanical in nature without any preceding dizziness or syncope or other concerning symptoms. She was found to have bilateral radial head fractures and scalp hematoma treated with arm slings. She returleft elbow with probable nondisplaced left radial head fracture.  XR right elbow nondisplaced right radial head fracture.  XR Right wrist without fracture or malalignment. She presented to [facility] ED approximately 2 weeks later on 10/10/19 with dizziness. It was determined that her history and physical exam were consistent with peripheral vertigo with a benign hints exam.  CT imaging without any evidence of ICH.  Pt deferred MRI and was referred to neurology as an outpatient. Pt saw Dr. Manuella Ghazi at [facility] neurology and she was referred to [facility] vestibular therapy for BPPV. She is complaining of swimmy headed spells mostly occuring when lying down initially, turning head from side to side during sleep. Symptoms are brief and only last for seconds. They improve if she closes her eyes. She continues to deny numbness, tingling, and weakness in her legs. Pt reports that neurology did not feel like she had a concussion. She is reporting some increased fatigue since her fall but denies any nausea, vomiting, confusion, or emotional lability. She does report some L frontal headaches near where she struck her head which didn't start until her dizziness began two weeks after the  fall.    Currently in Pain? No/denies             TREATMENT   Neuromuscular Re-education  Dix-Hallpike and roll tests performed and are negative bilalterally; Fixation suppression testing performed and WNL (see below); Performed FGA (28/30) and BERG (56/56); Pt completed FOTO (3) and DHI (0/100);     Oculomotor Exam- Fixation Suppressed  Findings Comments  Ocular Alignment normal   Spontaneous Nystagmus normal   Gaze-Holding Nystagmus normal   End-Gaze Nystagmus normal   Head Shaking Nystagmus normal   Pressure-Induced Nystagmus not examined    Hyperventilation Induced Nystagmus not examined   Skull Vibration Induced Nystagmus not examined       Pt reports 100% resolution of her symptoms since the initial evaluation. Performed fixation suppression testing which is WNL today. Pt scored 28/30 on the FGA and 56/56 on the BERG and does not demonstrate any risk for falls. FOTO updated today and pt scored 93 which improved from 71 at initial evaluation. Her [place] improved from 22/100 at initial evaluation to 0/100 today. She is independent performing the Epley maneuver at home. Pt provided education regarding indications to return to therapy if symptoms return. Pt will be discharged on this date having met all of her goals with full symptom resolution.                       PT Short Term Goals - 11/16/19 0849      PT SHORT TERM GOAL #1   Title Pt will be independent with HEP in order to decrease dizziness in order to improve function at home.    Time 4    Period Weeks    Status Achieved    Target Date 01/26/20             PT Long Term Goals - 11/16/19 0849      PT LONG TERM GOAL #1   Title Pt will report no further episodes of vertigo when rolling in bed, bending over, or looking up toward the ceiling    Baseline 11/15/19: 100% resolution    Time 8    Period Weeks    Status Achieved      PT LONG TERM GOAL #2   Title Pt will decrease DHI score by at least 18 points in order to demonstrate clinically significant reduction in disability    Baseline 11/03/19: 22/100; 11/15/19: 0/100    Time 8    Period Weeks    Status Achieved      PT LONG TERM GOAL #3   Title Pt will improve FOTO score to at least 83 in order to demonstrate significant improvement in her function.    Baseline 11/03/19: 71; 11/15/19: 93    Time 8    Period Weeks    Status Achieved                 Plan - 11/15/19 1609    Clinical Impression Statement Pt reports 100% resolution of her symptoms since the initial evaluation.  Performed fixation suppression testing which is WNL today. Pt scored 28/30 on the FGA and 56/56 on the BERG and does not demonstrate any risk for falls. FOTO updated today and pt scored 93 which improved from 71 at initial evaluation. Her [place] improved from 22/100 at initial evaluation to 0/100 today. She is independent performing the Epley maneuver at home. Pt provided education regarding indications to return to therapy if symptoms return. Pt will be discharged  on this date having met all of her goals with full symptom resolution.    Personal Factors and Comorbidities Comorbidity 1    Comorbidities HTN    Examination-Activity Limitations [place];Stairs    Examination-Participation Restrictions Community Activity;Interpersonal Relationship    Stability/Clinical Decision Making Unstable/Unpredictable    Rehab Potential Excellent    PT Frequency 1x / week    PT Duration 8 weeks    PT Treatment/Interventions ADLs/Self Care Home Management;Aquatic Therapy;Biofeedback;Canalith Repostioning;Cryotherapy;Electrical Stimulation;Moist Heat;Traction;DME Instruction;Gait training;Stair training;Functional mobility training;Therapeutic activities;Therapeutic exercise;Balance training;Neuromuscular re-education;Patient/family education;Manual techniques;Passive range of motion;Dry needling;Vestibular    PT Next Visit Plan Discharge    PT Home Exercise Plan Access Code: [email or web address]    Consulted and Agree with Plan of Care Patient           Patient will benefit from skilled therapeutic intervention in order to improve the following deficits and impairments:  Dizziness  Visit Diagnosis: Dizziness and giddiness     Problem List There are no problems to display for this patient.  Phillips Grout PT, DPT, GCS  Corine Solorio 11/16/2019, 8:54 AM  [place] MAIN [facility] SERVICES [address] [place], [place], [address] Phone: [phone]   Fax:  [phone]  Name: Alaisha Eversley MRN: [id number] Date of Birth: 31-Aug-1950

## 2019-12-01 ENCOUNTER — Ambulatory Visit: Payer: Medicare PPO

## 2019-12-09 ENCOUNTER — Ambulatory Visit
Admission: RE | Admit: 2019-12-09 | Discharge: 2019-12-09 | Disposition: A | Discharge status: Home / Self Care | Payer: Medicare PPO | Source: Ambulatory Visit | Attending: Physician Assistant | Admitting: Physician Assistant

## 2019-12-09 ENCOUNTER — Other Ambulatory Visit: Payer: Self-pay

## 2019-12-09 DIAGNOSIS — Z1231 Encounter for screening mammogram for malignant neoplasm of breast: Secondary | ICD-10-CM | POA: Insufficient documentation

## 2019-12-29 LAB — COLOGUARD: COLOGUARD: NEGATIVE

## 2020-11-06 ENCOUNTER — Other Ambulatory Visit: Payer: Self-pay | Admitting: Physician Assistant

## 2020-11-06 DIAGNOSIS — Z1231 Encounter for screening mammogram for malignant neoplasm of breast: Secondary | ICD-10-CM

## 2020-12-11 ENCOUNTER — Ambulatory Visit
Admission: RE | Admit: 2020-12-11 | Discharge: 2020-12-11 | Disposition: A | Discharge status: Home / Self Care | Payer: Medicare PPO | Source: Ambulatory Visit | Attending: Physician Assistant | Admitting: Physician Assistant

## 2020-12-11 ENCOUNTER — Other Ambulatory Visit: Payer: Self-pay

## 2020-12-11 DIAGNOSIS — Z1231 Encounter for screening mammogram for malignant neoplasm of breast: Secondary | ICD-10-CM | POA: Diagnosis present

## 2021-05-12 IMAGING — CT CT HEAD W/O CM
3 series · 16 of 47 positions shown, 19 images · non-contrast
Comparison: 09/25/2019

CLINICAL DATA: Dizziness

EXAM:
CT HEAD WITHOUT CONTRAST
TECHNIQUE: Contiguous axial images were obtained from the base of the skull
through the vertex without intravenous contrast.

[Series 2: head wo · axial · 0.42mm/px · z∈[-63,+62]mm · 10 of 30 slices shown, 13 images]
[im 3/30  brain]
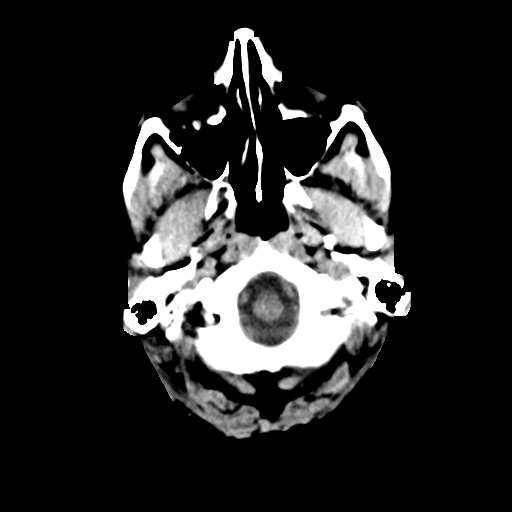
[im 3/30  bone]
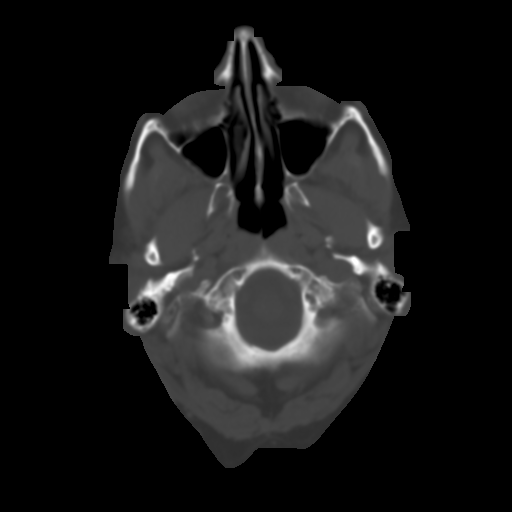
[im 6/30  brain]
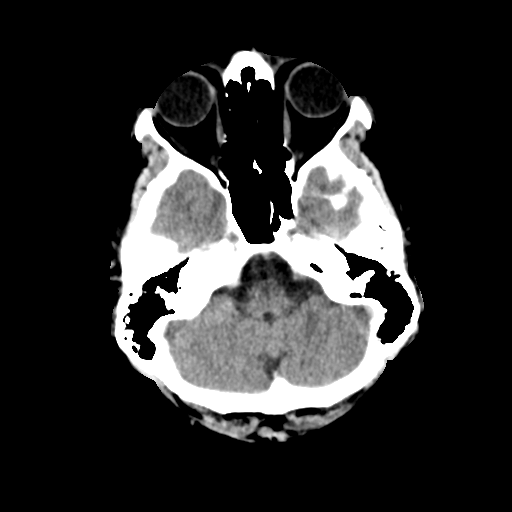
[im 9/30  brain]
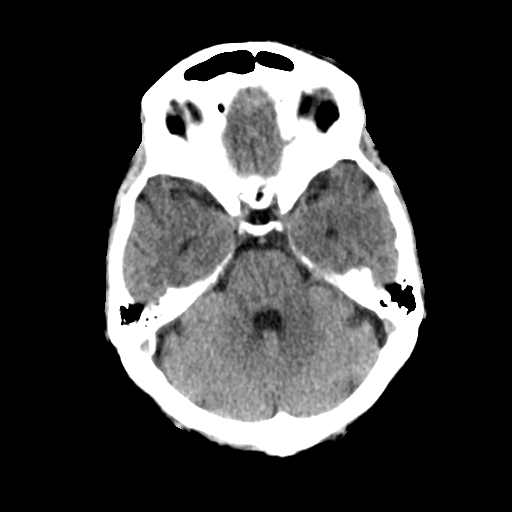
[im 11/30  brain]
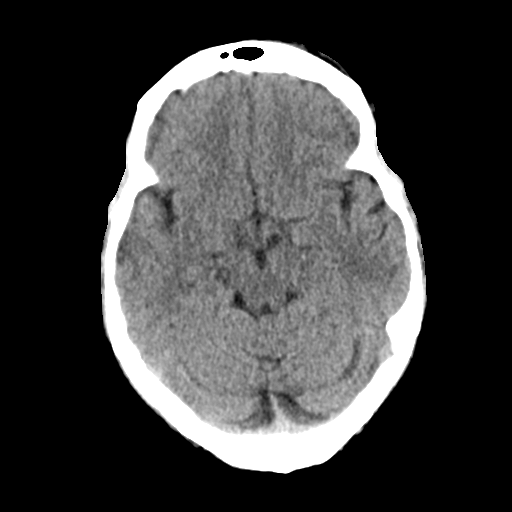
[im 14/30  brain]
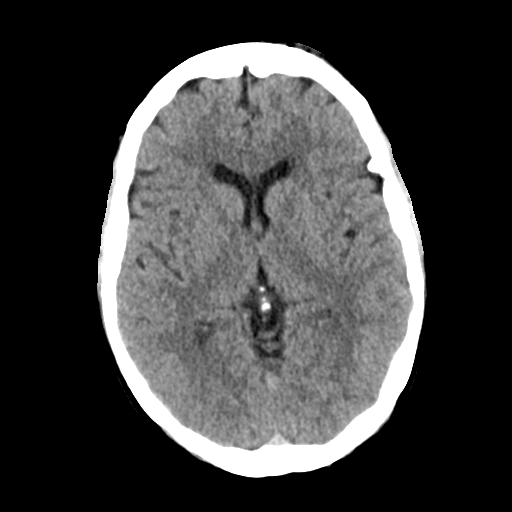
[im 14/30  bone]
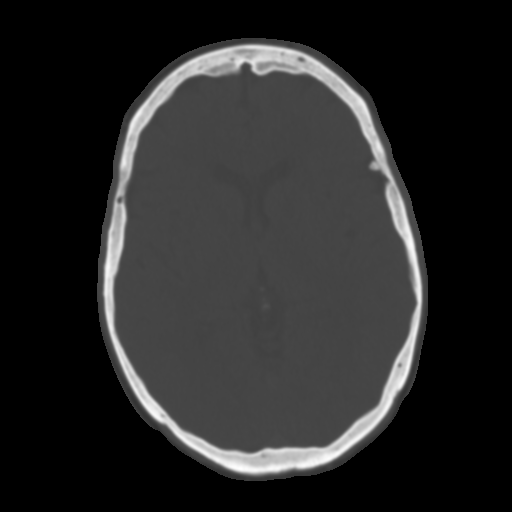
[im 17/30  brain]
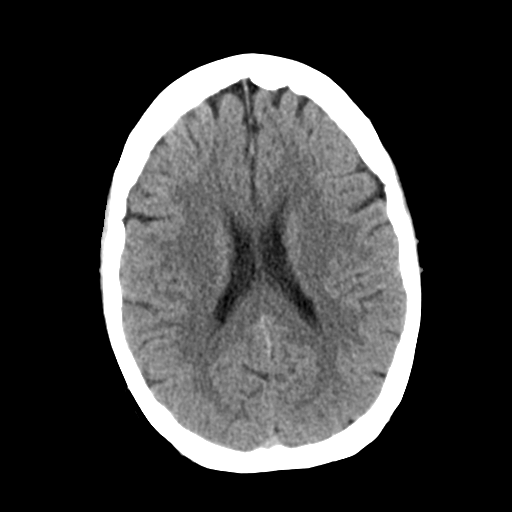
[im 20/30  brain]
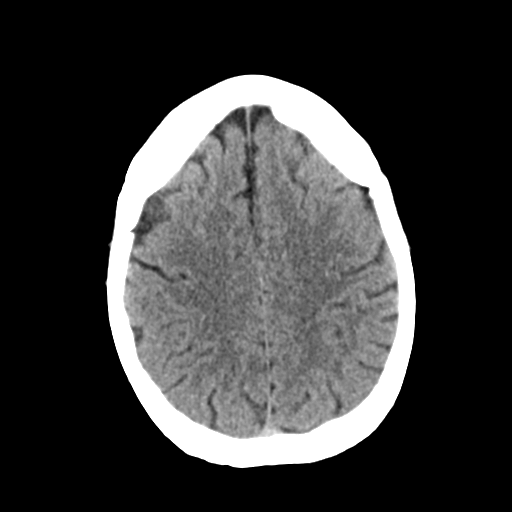
[im 23/30  brain]
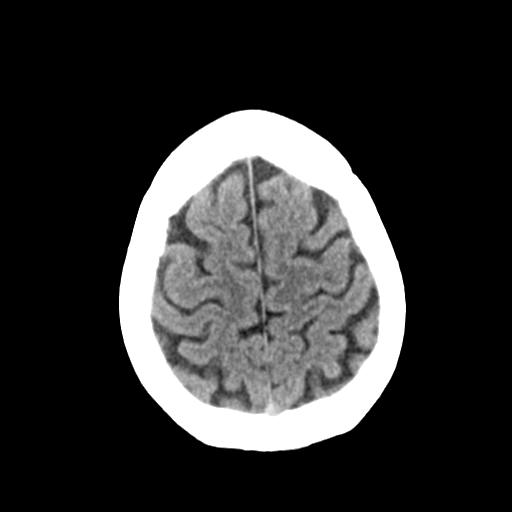
[im 25/30  brain]
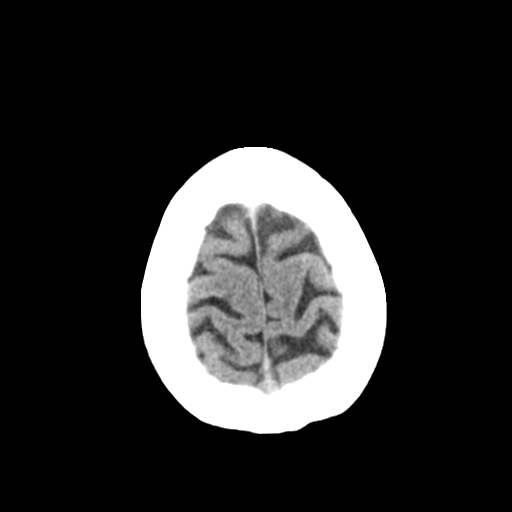
[im 25/30  bone]
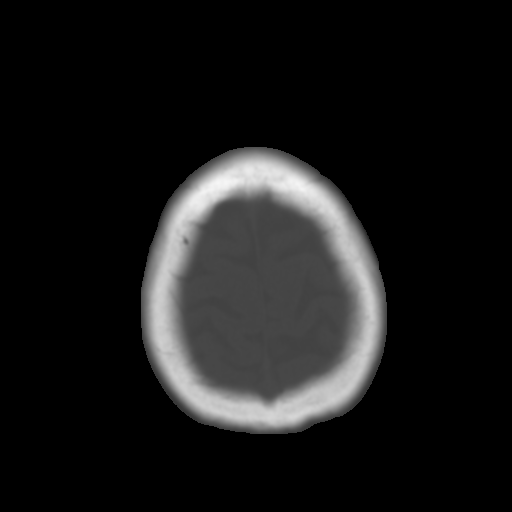
[im 28/30  brain]
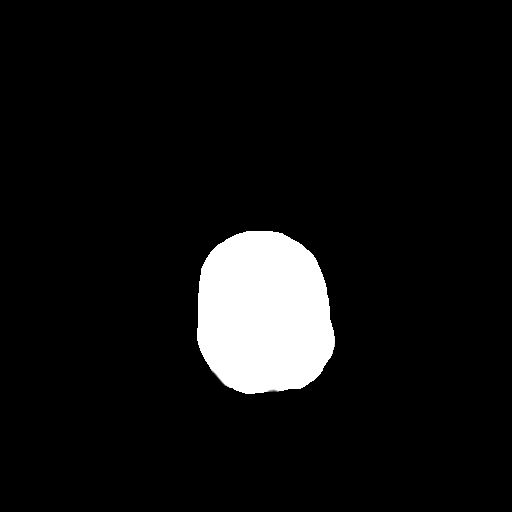

[Series 4: coronal soft tissue · coronal · 0.30mm/px · 3 of 69 slices shown]
[im 23/69  brain]
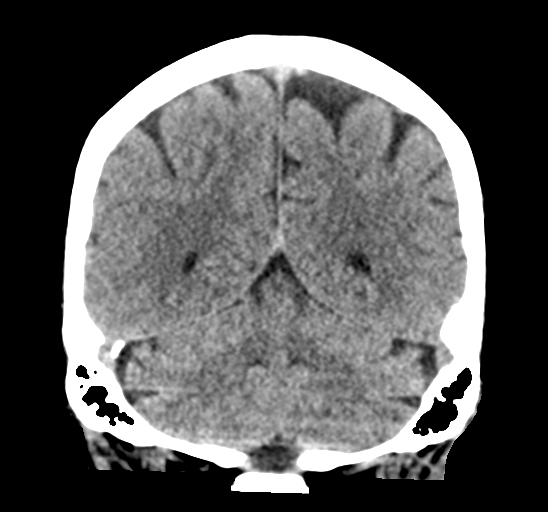
[im 31/69  brain]
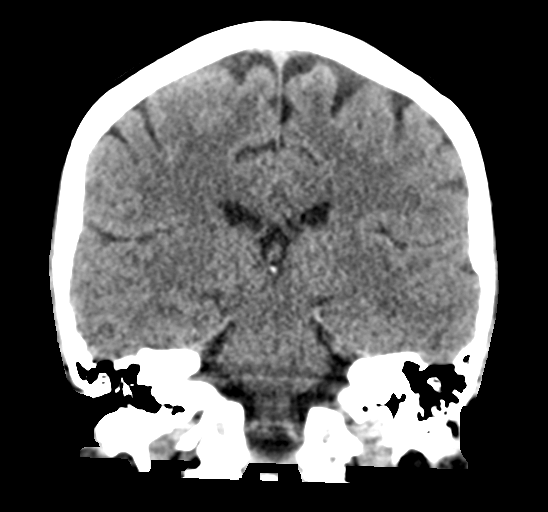
[im 38/69  brain]
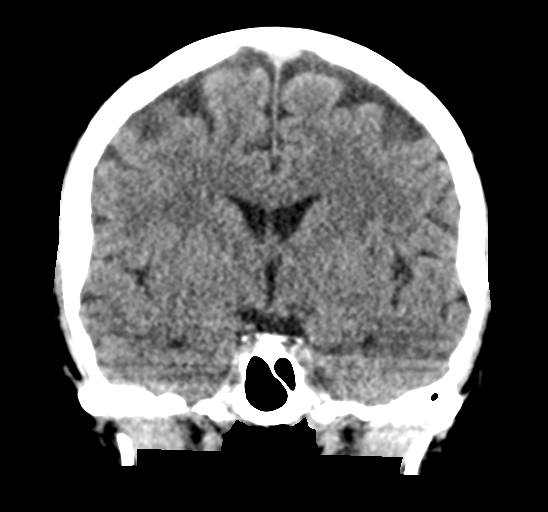

[Series 5: sagittal soft tissue · sagittal · 0.31mm/px · 3 of 54 slices shown]
[im 18/54  brain]
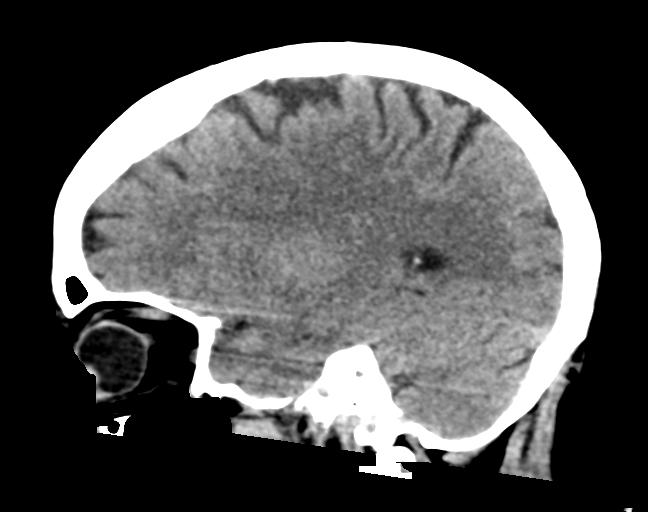
[im 27/54  brain]
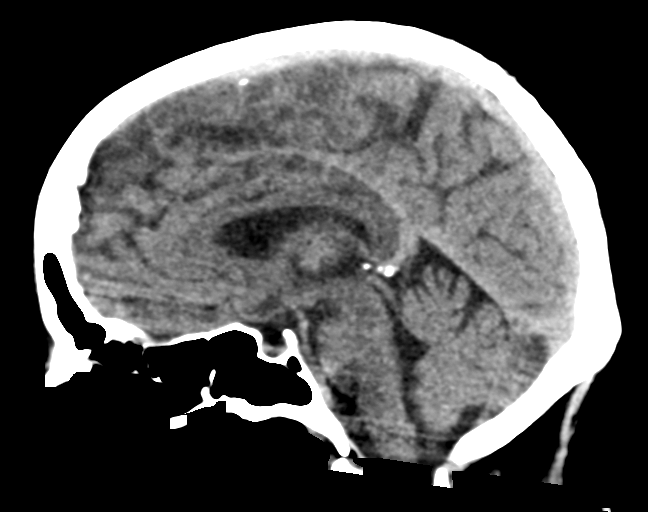
[im 36/54  brain]
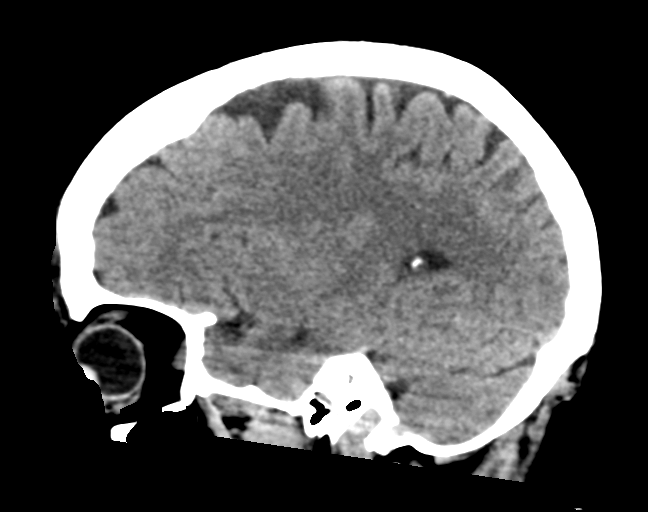

[16 of 47 positions shown; findings below may reference images not displayed]

FINDINGS: Brain: No acute intracranial abnormality. Specifically, no
hemorrhage, hydrocephalus, mass lesion, acute infarction, or
significant intracranial injury.

Vascular: No hyperdense vessel or unexpected calcification.

Skull: No acute calvarial abnormality.

Sinuses/Orbits: Visualized paranasal sinuses and mastoids clear.
Orbital soft tissues unremarkable.

Other: None
IMPRESSION: Normal study.

## 2021-08-10 ENCOUNTER — Other Ambulatory Visit: Payer: Self-pay | Admitting: Physician Assistant

## 2021-08-10 DIAGNOSIS — Z1231 Encounter for screening mammogram for malignant neoplasm of breast: Secondary | ICD-10-CM

## 2021-09-15 ENCOUNTER — Emergency Department: Payer: Medicare PPO

## 2021-09-15 ENCOUNTER — Emergency Department
Admission: EM | Admit: 2021-09-15 | Discharge: 2021-09-15 | Disposition: A | Discharge status: Home / Self Care | Payer: Medicare PPO | Attending: Emergency Medicine | Admitting: Emergency Medicine

## 2021-09-15 DIAGNOSIS — R1032 Left lower quadrant pain: Secondary | ICD-10-CM

## 2021-09-15 DIAGNOSIS — K5732 Diverticulitis of large intestine without perforation or abscess without bleeding: Secondary | ICD-10-CM | POA: Insufficient documentation

## 2021-09-15 LAB — URINALYSIS, ROUTINE W REFLEX MICROSCOPIC
Bacteria, UA: NONE SEEN
Bilirubin Urine: NEGATIVE
Glucose, UA: NEGATIVE mg/dL
Ketones, ur: NEGATIVE mg/dL
Nitrite: NEGATIVE
Protein, ur: NEGATIVE mg/dL
Specific Gravity, Urine: 1.012 (ref 1.005–1.030)
pH: 5 (ref 5.0–8.0)

## 2021-09-15 LAB — COMPREHENSIVE METABOLIC PANEL
ALT: 19 U/L (ref 0–44)
AST: 22 U/L (ref 15–41)
Albumin: 4.5 g/dL (ref 3.5–5.0)
Alkaline Phosphatase: 52 U/L (ref 38–126)
Anion gap: 9 (ref 5–15)
BUN: 10 mg/dL (ref 8–23)
CO2: 22 mmol/L (ref 22–32)
Calcium: 9.1 mg/dL (ref 8.9–10.3)
Chloride: 103 mmol/L (ref 98–111)
Creatinine, Ser: 0.71 mg/dL (ref 0.44–1.00)
GFR, Estimated: >60 mL/min (ref >60)
Glucose, Bld: 148 mg/dL — ABNORMAL HIGH (ref 70–99)
Potassium: 3.5 mmol/L (ref 3.5–5.1)
Sodium: 134 mmol/L — ABNORMAL LOW (ref 135–145)
Total Bilirubin: 1.8 mg/dL — ABNORMAL HIGH (ref 0.3–1.2)
Total Protein: 7.5 g/dL (ref 6.5–8.1)

## 2021-09-15 LAB — CBC
HCT: 40.9 % (ref 36.0–46.0)
Hemoglobin: 13.8 g/dL (ref 12.0–15.0)
MCH: 29.1 pg (ref 26.0–34.0)
MCHC: 33.7 g/dL (ref 30.0–36.0)
MCV: 86.3 fL (ref 80.0–100.0)
Platelets: 205 10*3/uL (ref 150–400)
RBC: 4.74 MIL/uL (ref 3.87–5.11)
RDW: 13.3 % (ref 11.5–15.5)
WBC: 10.7 10*3/uL — ABNORMAL HIGH (ref 4.0–10.5)
nRBC: 0 % (ref 0.0–0.2)

## 2021-09-15 LAB — LIPASE, BLOOD: Lipase: 38 U/L (ref 11–51)

## 2021-09-15 MED ORDER — HYDROCODONE-ACETAMINOPHEN 5-325 MG PO TABS: 1.0000 | ORAL_TABLET | ORAL | 0 refills | Status: AC | PRN

## 2021-09-15 MED ORDER — CIPROFLOXACIN HCL 500 MG PO TABS: 500.0000 mg | ORAL_TABLET | Freq: Two times a day (BID) | ORAL | 0 refills | Status: AC

## 2021-09-15 MED ORDER — IOHEXOL 300 MG/ML  SOLN
100.0000 mL | Freq: Once | INTRAMUSCULAR | Status: AC | PRN
  Administered 2021-09-15: 100 mL via INTRAVENOUS

## 2021-09-15 MED ORDER — METRONIDAZOLE 500 MG PO TABS: 500.0000 mg | ORAL_TABLET | Freq: Three times a day (TID) | ORAL | 0 refills | Status: AC

## 2021-09-15 MED ORDER — CIPROFLOXACIN HCL 500 MG PO TABS
500.0000 mg | ORAL_TABLET | Freq: Once | ORAL | Status: AC
  Administered 2021-09-15: 500 mg via ORAL
  Filled 2021-09-15: qty 1

## 2021-09-15 MED ORDER — METRONIDAZOLE 500 MG PO TABS
500.0000 mg | ORAL_TABLET | Freq: Once | ORAL | Status: AC
  Administered 2021-09-15: 500 mg via ORAL
  Filled 2021-09-15: qty 1

## 2021-09-15 NOTE — ED Triage Notes (Signed)
Pt to ED for LLQ pain that started last night. +nausea. Denies v/d Reports low grade fever that started last night.  Denies burning with urination or urinary sx.

## 2021-09-15 NOTE — ED Provider Notes (Signed)
[facility] Provider Note   Event Date/Time   First MD Initiated Contact with Patient 09/15/21 1531     (approximate) History  Abdominal Pain  HPI Amanda Whitney is a 71 y.o. female with a stated past medical history of diverticulosis who presents for left lower quadrant abdominal pain that began approximately 24 hours prior to arrival and has been worsening since onset.  Patient states that initially this pain was constant at approximately 6/10 however now is somewhat intermittent in nature.  Patient states that this is likely because she has not had anything to eat since 5 AM this morning.  Patient does not endorse any recent travel, antibiotic use, sick contacts, or food out of the ordinary ROS: Patient currently denies any vision changes, tinnitus, difficulty speaking, facial droop, sore throat, chest pain, shortness of breath, nausea/vomiting/diarrhea, dysuria, or weakness/numbness/paresthesias in any extremity   Physical Exam  Triage Vital Signs: ED Triage Vitals  Enc Vitals Group     BP 09/15/21 1131 (!) 143/71     Pulse Rate 09/15/21 1131 88     Resp 09/15/21 1131 16     Temp 09/15/21 1131 99 F (37.2 C)     Temp Source 09/15/21 1131 Oral     SpO2 09/15/21 1131 98 %     Weight 09/15/21 1132 155 lb (70.3 kg)     Height 09/15/21 1132 '4\' 11"'$  (1.499 m)     Head Circumference --      Peak Flow --      Pain Score 09/15/21 1132 6     Pain Loc --      Pain Edu? --      Excl. in [place]? --    Most recent vital signs: Vitals:   09/15/21 1630 09/15/21 1703  BP: 131/68 130/65  Pulse: 84 77  Resp: 18 17  Temp:  99.4 F (37.4 C)  SpO2: 94% 96%   General: Awake, oriented x4. CV:  Good peripheral perfusion.  Resp:  Normal effort.  Abd:  No distention.  Mild left lower quadrant tenderness to palpation Other:  Elderly overweight Caucasian female laying in bed in no acute distress ED Results / Procedures / Treatments  Labs (all labs ordered are listed, but  only abnormal results are displayed) Labs Reviewed  COMPREHENSIVE METABOLIC PANEL - Abnormal; Notable for the following components:      Result Value   Sodium 134 (*)    Glucose, Bld 148 (*)    Total Bilirubin 1.8 (*)    All other components within normal limits  CBC - Abnormal; Notable for the following components:   WBC 10.7 (*)    All other components within normal limits  URINALYSIS, ROUTINE W REFLEX MICROSCOPIC - Abnormal; Notable for the following components:   Color, Urine YELLOW (*)    APPearance HAZY (*)    Hgb urine dipstick SMALL (*)    Leukocytes,Ua SMALL (*)    All other components within normal limits  LIPASE, BLOOD   RADIOLOGY ED MD interpretation: CT of the abdomen and pelvis with IV contrast shows acute uncomplicated sigmoid colonic diverticulitis -Agree with radiology assessment Official radiology report(s): CT Abdomen Pelvis W Contrast  Result Date: 09/15/2021 CLINICAL DATA:  Left lower quadrant abdominal pain with low-grade fever since last night. EXAM: CT ABDOMEN AND PELVIS WITH CONTRAST TECHNIQUE: Multidetector CT imaging of the abdomen and pelvis was performed using the standard protocol following bolus administration of intravenous contrast. RADIATION DOSE REDUCTION: This exam was performed  according to the departmental dose-optimization program which includes automated exposure control, adjustment of the mA and/or kV according to patient size and/or use of iterative reconstruction technique. CONTRAST:  130m OMNIPAQUE IOHEXOL 300 MG/ML  SOLN COMPARISON:  CT October 24, 2018. FINDINGS: Lower chest: Bilateral calcified granulomata.  Small hiatal hernia. Hepatobiliary: Hepatomegaly measuring 19.7 cm. Hepatic steatosis. No suspicious hepatic lesion. Cholelithiasis without findings of acute cholecystitis. No biliary ductal dilation. Pancreas: No pancreatic ductal dilation or evidence of acute inflammation. Spleen: No splenomegaly or focal splenic lesion.  Adrenals/Urinary Tract: Bilateral adrenal glands appear normal. No hydronephrosis. Kidneys demonstrate symmetric enhancement and excretion of contrast material. Urinary bladder is unremarkable for degree of distension. Stomach/Bowel: No radiopaque enteric contrast material was administered. Small hiatal hernia otherwise the stomach is unremarkable for degree of distension. No pathologic dilation of small or large bowel. Pancolonic diverticulosis with acute uncomplicated sigmoid colonic diverticulitis. Normal appendix. Vascular/Lymphatic: Normal caliber abdominal aorta. No pathologically enlarged abdominal or pelvic lymph nodes. Reproductive: Uterus and bilateral adnexa are unremarkable. Other: No walled off fluid collections.  No pneumoperitoneum. Musculoskeletal: No acute osseous abnormality. Multilevel degenerative changes spine. IMPRESSION: 1. Acute uncomplicated sigmoid colonic diverticulitis. 2. Hepatic steatosis with mild hepatomegaly. 3. Cholelithiasis without findings of acute cholecystitis. 4. Small hiatal hernia. Electronically Signed   By: JDahlia BailiffM.D.   On: 09/15/2021 17:44   PROCEDURES: Critical Care performed: No .1-3 Lead EKG Interpretation  Performed by: BNaaman Plummer MD Authorized by: BNaaman Plummer MD     Interpretation: normal     ECG rate:  78   ECG rate assessment: normal     Rhythm: sinus rhythm     Ectopy: none     Conduction: normal    MEDICATIONS ORDERED IN ED: Medications  iohexol (OMNIPAQUE) 300 MG/ML solution 100 mL (100 mLs Intravenous Contrast Given 09/15/21 1717)  ciprofloxacin (CIPRO) tablet 500 mg (500 mg Oral Given 09/15/21 1807)  metroNIDAZOLE (FLAGYL) tablet 500 mg (500 mg Oral Given 09/15/21 1807)   IMPRESSION / MDM / ASSESSMENT AND PLAN / ED COURSE  I reviewed the triage vital signs and the nursing notes.                             The patient is on the cardiac monitor to evaluate for evidence of arrhythmia and/or significant heart rate  changes. Patient's presentation is most consistent with acute presentation with potential threat to life or bodily function. Patient has diverticulitis that is amenable to oral antibiotics. Patient has no peritoneal signs or signs of perforation. Patients symptoms not typical for other emergent causes of abdominal pain such as, but not limited to, appendicitis, abdominal aortic aneurysm, surgical biliary disease, acute coronary syndrome, etc.  Patient will be discharged with strict return precautions and follow up with primary MD within 12-24 hours for further evaluation.  Patient understands that they may require admission and IV antibiotics and possibly surgery if they do not improve with oral antibiotics.   FINAL CLINICAL IMPRESSION(S) / ED DIAGNOSES   Final diagnoses:  Diverticulitis large intestine w/o perforation or abscess w/o bleeding  Left lower quadrant abdominal pain   Rx / DC Orders   ED Discharge Orders          Ordered    ciprofloxacin (CIPRO) 500 MG tablet  2 times daily        09/15/21 1802    metroNIDAZOLE (FLAGYL) 500 MG tablet  3 times daily  09/15/21 1802    HYDROcodone-acetaminophen (NORCO) 5-325 MG tablet  Every 4 hours PRN        09/15/21 1802           Note:  This document was prepared using Dragon voice recognition software and may include unintentional dictation errors.   Naaman Plummer, MD 09/15/21 1919

## 2021-09-15 NOTE — ED Provider Triage Note (Signed)
Emergency Medicine Provider Triage Evaluation Note  Amanda Whitney , a 71 y.o. female  was evaluated in triage.  Pt complains of LLQ pain that began last pm. Low grade temp. history of diverticulitis.  Review of Systems  Positive: +nausea Negative: No n/d, no dysuria or prior uti  Physical Exam  BP (!) 143/71   Pulse 88   Temp 99 F (37.2 C) (Oral)   Resp 16   Ht '4\' 11"'$  (1.499 m)   Wt 70.3 kg   SpO2 98%   BMI 31.31 kg/m  Gen:   Awake, no distress   Resp:  Normal effort  MSK:   Moves extremities without difficulty  Other:    Medical Decision Making  Medically screening exam initiated at 11:32 AM.  Appropriate orders placed.  Amanda Whitney was informed that the remainder of the evaluation will be completed by another provider, this initial triage assessment does not replace that evaluation, and the importance of remaining in the ED until their evaluation is complete.     Johnn Hai, PA-C 09/15/21 1200

## 2021-10-09 ENCOUNTER — Encounter: Payer: Medicare PPO | Attending: Physician Assistant | Admitting: *Deleted

## 2021-10-09 ENCOUNTER — Encounter: Payer: Self-pay | Admitting: *Deleted

## 2021-10-09 VITALS — BP 114/62 | Ht 59.0 in | Wt 148.6 lb

## 2021-10-09 DIAGNOSIS — E119 Type 2 diabetes mellitus without complications: Secondary | ICD-10-CM | POA: Diagnosis present

## 2021-10-09 NOTE — Progress Notes (Signed)
Diabetes Self-Management Education  Visit Type: First/Initial  Appt. Start Time: 0815 Appt. End Time: 0915  10/09/2021  [name], identified by name and date of birth, is a 71 y.o. female with a diagnosis of Diabetes: Type 2.   ASSESSMENT  Blood pressure 114/62, height '4\' 11"'$  (1.499 m), weight 148 lb 9.6 oz (67.4 kg). Body mass index is 30.01 kg/m.   Diabetes Self-Management Education - 10/09/21 0940       Visit Information   Visit Type First/Initial      Initial Visit   Diabetes Type Type 2    Date Diagnosed 4 years ago - had A1C of 6.6% but has kept levels in pre-Diabetes range until June    Are you currently following a meal plan? Yes    What type of meal plan do you follow? "more fiber, less sugar and carbs; lean meat (chicken,turkey); rye bread; cut out nuts, corn, seeds, popcorn; eating yogurt daily; eating out less"    Are you taking your medications as prescribed? Yes      Health Coping   How would you rate your overall health? Good;Fair      Psychosocial Assessment   Patient Belief/Attitude about Diabetes Motivated to manage diabetes    What is the hardest part about your diabetes right now, causing you the most concern, or is the most worrisome to you about your diabetes?   Making healty food and beverage choices    Self-care barriers None    Self-management support Doctor's office;Family    Patient Concerns Nutrition/Meal planning;Glycemic Control;Weight Control;Monitoring    Special Needs None    Preferred Learning Style Auditory;Visual    Learning Readiness Change in progress    How often do you need to have someone help you when you read instructions, pamphlets, or other written materials from your doctor or pharmacy? 1 - Never    What is the last grade level you completed in school? college      Pre-Education Assessment   Patient understands the diabetes disease and treatment process. Needs Instruction    Patient understands incorporating nutritional  management into lifestyle. Needs Instruction    Patient undertands incorporating physical activity into lifestyle. Needs Instruction    Patient understands using medications safely. Needs Instruction    Patient understands monitoring blood glucose, interpreting and using results Comprehends key points    Patient understands prevention, detection, and treatment of acute complications. Needs Instruction    Patient understands prevention, detection, and treatment of chronic complications. Needs Review    Patient understands how to develop strategies to address psychosocial issues. Needs Instruction    Patient understands how to develop strategies to promote health/change behavior. Needs Instruction      Complications   Last HgB A1C per patient/outside source 6.5 %   07/30/2021   How often do you check your blood sugar? 1-2 times/day    Fasting Blood glucose range (mg/dL) 70-129   Pt reports avg 118 mg/dL - fasting.   Postprandial Blood glucose range (mg/dL) 130-179   Pt reports pp's avg 163 mg/dL   Have you had a dilated eye exam in the past 12 months? Yes    Have you had a dental exam in the past 12 months? Yes    Are you checking your feet? Yes    How many days per week are you checking your feet? 1      Dietary Intake   Breakfast egg, toast, fruit (1/2 banana, 1/2 apple); cereal and milk  with fruit or yogurt)    Lunch [place] or chicken sandwich - sometimes ham with cheese and yogurt    Snack (afternoon) yogurt, goldfish, fruit (pears, peaches, other 1/2 of banana or apple)    Dinner chicken, [place], hamburger; potato; beans, peas, carrots, green beans    Beverage(s) water, unsweetened tea      Activity / Exercise   Activity / Exercise Type ADL's      Patient Education   Previous Diabetes Education No    Disease Pathophysiology Definition of diabetes, type 1 and 2, and the diagnosis of diabetes;Factors that contribute to the development of diabetes    Healthy Eating Role of diet in the  treatment of diabetes and the relationship between the three main macronutrients and blood glucose level;Food label reading, portion sizes and measuring food.;Reviewed blood glucose goals for pre and post meals and how to evaluate the patients' food intake on their blood glucose level.    Being Active Role of exercise on diabetes management, blood pressure control and cardiac health.    Monitoring Purpose and frequency of SMBG.;Taught/discussed recording of test results and interpretation of SMBG.;Identified appropriate SMBG and/or A1C goals.    Chronic complications Relationship between chronic complications and blood glucose control    Diabetes Stress and Support Identified and addressed patients feelings and concerns about diabetes;Role of stress on diabetes      Individualized Goals (developed by patient)   Reducing Risk Other (comment)   improve blood sugars, prevent diabetes complications, lose weight     Outcomes   Expected Outcomes Demonstrated interest in learning. Expect positive outcomes    Future DMSE 4-6 wks           Individualized Plan for Diabetes Self-Management Training:   Learning Objective:  Patient will have a greater understanding of diabetes self-management. Patient education plan is to attend individual and/or group sessions per assessed needs and concerns.   Plan:   Patient Instructions  Check blood sugars 1 x day before breakfast or 2 hrs after one meal every day Bring blood sugar records to the next class  Exercise:  Continue exercises per Physical Therapy (gradually increase to 150 minutes/week)  Eat 3 meals day,   1  snack a day Space meals 4-6 hours apart Include 1 serving of protein when eating fruit for a snack  Return for classes on:     Expected Outcomes:  Demonstrated interest in learning. Expect positive outcomes  Education material provided:  General Meal Planning Guidelines Simple Meal Plan  If problems or questions, patient to contact  team via:   Johny Drilling, RN, [facility] [phone]  Future DSME appointment: 3 weeks October 29, 2021 for Diabetes Class 1

## 2021-10-09 NOTE — Patient Instructions (Signed)
Check blood sugars 1 x day before breakfast or 2 hrs after one meal every day Bring blood sugar records to the next class  Exercise:  Continue exercises per Physical Therapy (gradually increase to 150 minutes/week)  Eat 3 meals day,   1  snack a day Space meals 4-6 hours apart Include 1 serving of protein when eating fruit for a snack  Return for classes on:

## 2021-10-29 ENCOUNTER — Encounter: Payer: Medicare PPO | Admitting: Dietician

## 2021-10-29 ENCOUNTER — Encounter: Payer: Self-pay | Admitting: Dietician

## 2021-10-29 VITALS — Ht 59.0 in | Wt 145.2 lb

## 2021-10-29 DIAGNOSIS — E119 Type 2 diabetes mellitus without complications: Secondary | ICD-10-CM | POA: Diagnosis not present

## 2021-10-29 NOTE — Progress Notes (Signed)

## 2021-11-05 ENCOUNTER — Encounter: Payer: Self-pay | Admitting: *Deleted

## 2021-11-05 ENCOUNTER — Encounter: Payer: Medicare PPO | Attending: Physician Assistant | Admitting: *Deleted

## 2021-11-05 VITALS — Wt 143.6 lb

## 2021-11-05 DIAGNOSIS — E119 Type 2 diabetes mellitus without complications: Secondary | ICD-10-CM | POA: Insufficient documentation

## 2021-11-05 DIAGNOSIS — Z713 Dietary counseling and surveillance: Secondary | ICD-10-CM | POA: Insufficient documentation

## 2021-11-05 NOTE — Progress Notes (Signed)

## 2021-11-12 ENCOUNTER — Encounter: Payer: Self-pay | Admitting: *Deleted

## 2021-11-12 ENCOUNTER — Encounter: Payer: Medicare PPO | Admitting: *Deleted

## 2021-11-12 VITALS — BP 122/70 | Wt 142.8 lb

## 2021-11-12 DIAGNOSIS — E119 Type 2 diabetes mellitus without complications: Secondary | ICD-10-CM | POA: Diagnosis not present

## 2021-11-12 NOTE — Progress Notes (Signed)

## 2021-11-14 ENCOUNTER — Encounter: Payer: Self-pay | Admitting: *Deleted

## 2022-01-10 ENCOUNTER — Ambulatory Visit
Admission: RE | Admit: 2022-01-10 | Discharge: 2022-01-10 | Disposition: A | Discharge status: Home / Self Care | Payer: Medicare PPO | Source: Ambulatory Visit | Attending: Physician Assistant | Admitting: Physician Assistant

## 2022-01-10 DIAGNOSIS — Z1231 Encounter for screening mammogram for malignant neoplasm of breast: Secondary | ICD-10-CM | POA: Diagnosis present

## 2022-09-23 ENCOUNTER — Encounter: Payer: Self-pay | Admitting: Dietician

## 2022-12-23 ENCOUNTER — Other Ambulatory Visit: Payer: Self-pay | Admitting: Physician Assistant

## 2022-12-23 DIAGNOSIS — Z1231 Encounter for screening mammogram for malignant neoplasm of breast: Secondary | ICD-10-CM

## 2023-01-15 ENCOUNTER — Ambulatory Visit
Admission: RE | Admit: 2023-01-15 | Discharge: 2023-01-15 | Disposition: A | Discharge status: Home / Self Care | Payer: Medicare PPO | Source: Ambulatory Visit | Attending: Physician Assistant | Admitting: Physician Assistant

## 2023-01-15 DIAGNOSIS — Z1231 Encounter for screening mammogram for malignant neoplasm of breast: Secondary | ICD-10-CM | POA: Diagnosis present

## 2023-08-11 ENCOUNTER — Other Ambulatory Visit: Payer: Self-pay | Admitting: Physician Assistant

## 2023-08-11 DIAGNOSIS — Z1231 Encounter for screening mammogram for malignant neoplasm of breast: Secondary | ICD-10-CM

## 2024-01-22 ENCOUNTER — Inpatient Hospital Stay
Admission: RE | Admit: 2024-01-22 | Discharge: 2024-01-22 | Attending: Physician Assistant | Admitting: Physician Assistant

## 2024-01-22 DIAGNOSIS — Z1231 Encounter for screening mammogram for malignant neoplasm of breast: Secondary | ICD-10-CM | POA: Insufficient documentation
